# Patient Record
Sex: Male | Born: 1975 | Race: White | Hispanic: No | Marital: Married | Smoking: Former smoker
Health system: Southern US, Community
[De-identification: ages and names within clinical notes are randomized; demographics above are authoritative.]

## PROBLEM LIST (undated history)

## (undated) DIAGNOSIS — J069 Acute upper respiratory infection, unspecified: Secondary | ICD-10-CM

## (undated) DIAGNOSIS — R112 Nausea with vomiting, unspecified: Secondary | ICD-10-CM

## (undated) DIAGNOSIS — T4145XA Adverse effect of unspecified anesthetic, initial encounter: Secondary | ICD-10-CM

## (undated) DIAGNOSIS — T8859XA Other complications of anesthesia, initial encounter: Secondary | ICD-10-CM

## (undated) DIAGNOSIS — Z9889 Other specified postprocedural states: Secondary | ICD-10-CM

## (undated) DIAGNOSIS — I10 Essential (primary) hypertension: Secondary | ICD-10-CM

---

## 1993-10-05 DIAGNOSIS — R112 Nausea with vomiting, unspecified: Secondary | ICD-10-CM

## 1993-10-05 DIAGNOSIS — Z9889 Other specified postprocedural states: Secondary | ICD-10-CM

## 1993-10-05 HISTORY — PX: FRACTURE SURGERY: SHX138

## 1993-10-05 HISTORY — DX: Nausea with vomiting, unspecified: R11.2

## 1993-10-05 HISTORY — DX: Other specified postprocedural states: Z98.890

## 2005-04-22 ENCOUNTER — Emergency Department: Payer: Self-pay | Admitting: Emergency Medicine

## 2005-05-01 ENCOUNTER — Emergency Department: Payer: Self-pay | Admitting: Emergency Medicine

## 2008-03-15 ENCOUNTER — Emergency Department: Payer: Self-pay | Admitting: Emergency Medicine

## 2014-04-02 ENCOUNTER — Emergency Department: Payer: Self-pay | Admitting: Emergency Medicine

## 2014-08-06 ENCOUNTER — Emergency Department: Payer: Self-pay | Admitting: Emergency Medicine

## 2015-02-07 ENCOUNTER — Other Ambulatory Visit: Payer: Self-pay | Admitting: Internal Medicine

## 2015-02-07 DIAGNOSIS — N509 Disorder of male genital organs, unspecified: Secondary | ICD-10-CM

## 2015-02-08 ENCOUNTER — Inpatient Hospital Stay: Admission: RE | Admit: 2015-02-08 | Payer: Self-pay | Source: Ambulatory Visit

## 2015-02-08 ENCOUNTER — Other Ambulatory Visit: Payer: Self-pay

## 2015-02-08 ENCOUNTER — Ambulatory Visit: Payer: Self-pay

## 2015-02-12 ENCOUNTER — Ambulatory Visit
Admission: RE | Admit: 2015-02-12 | Discharge: 2015-02-12 | Disposition: A | Discharge status: Home / Self Care | Payer: BLUE CROSS/BLUE SHIELD | Source: Ambulatory Visit | Attending: Internal Medicine | Admitting: Internal Medicine

## 2015-02-12 ENCOUNTER — Ambulatory Visit: Admission: RE | Admit: 2015-02-12 | Payer: BLUE CROSS/BLUE SHIELD | Source: Ambulatory Visit

## 2015-02-12 DIAGNOSIS — N433 Hydrocele, unspecified: Secondary | ICD-10-CM | POA: Insufficient documentation

## 2015-02-12 DIAGNOSIS — N509 Disorder of male genital organs, unspecified: Secondary | ICD-10-CM

## 2015-02-12 DIAGNOSIS — N503 Cyst of epididymis: Secondary | ICD-10-CM | POA: Diagnosis not present

## 2015-09-05 DIAGNOSIS — J069 Acute upper respiratory infection, unspecified: Secondary | ICD-10-CM

## 2015-09-05 HISTORY — DX: Acute upper respiratory infection, unspecified: J06.9

## 2015-09-19 ENCOUNTER — Encounter
Admission: RE | Admit: 2015-09-19 | Discharge: 2015-09-19 | Disposition: A | Discharge status: Home / Self Care | Payer: Worker's Compensation | Source: Ambulatory Visit | Attending: Orthopedic Surgery | Admitting: Orthopedic Surgery

## 2015-09-19 DIAGNOSIS — Z01810 Encounter for preprocedural cardiovascular examination: Secondary | ICD-10-CM | POA: Insufficient documentation

## 2015-09-19 DIAGNOSIS — Z01812 Encounter for preprocedural laboratory examination: Secondary | ICD-10-CM | POA: Insufficient documentation

## 2015-09-19 HISTORY — DX: Other specified postprocedural states: Z98.890

## 2015-09-19 HISTORY — DX: Essential (primary) hypertension: I10

## 2015-09-19 HISTORY — DX: Adverse effect of unspecified anesthetic, initial encounter: T41.45XA

## 2015-09-19 HISTORY — DX: Other complications of anesthesia, initial encounter: T88.59XA

## 2015-09-19 HISTORY — DX: Nausea with vomiting, unspecified: R11.2

## 2015-09-19 HISTORY — DX: Acute upper respiratory infection, unspecified: J06.9

## 2015-09-19 LAB — URINALYSIS COMPLETE WITH MICROSCOPIC (ARMC ONLY)
BILIRUBIN URINE: NEGATIVE
Bacteria, UA: NONE SEEN
GLUCOSE, UA: NEGATIVE mg/dL
Hgb urine dipstick: NEGATIVE
KETONES UR: NEGATIVE mg/dL
Leukocytes, UA: NEGATIVE
Nitrite: NEGATIVE
PROTEIN: 100 mg/dL — AB
SQUAMOUS EPITHELIAL / LPF: NONE SEEN
Specific Gravity, Urine: 1.012 (ref 1.005–1.030)
pH: 5 (ref 5.0–8.0)

## 2015-09-19 LAB — PROTIME-INR
INR: 1.14
PROTHROMBIN TIME: 14.8 s (ref 11.4–15.0)

## 2015-09-19 LAB — CBC
HEMATOCRIT: 42.8 % (ref 40.0–52.0)
HEMOGLOBIN: 15.1 g/dL (ref 13.0–18.0)
MCH: 31.4 pg (ref 26.0–34.0)
MCHC: 35.4 g/dL (ref 32.0–36.0)
MCV: 88.7 fL (ref 80.0–100.0)
PLATELETS: 130 10*3/uL — AB (ref 150–440)
RBC: 4.83 MIL/uL (ref 4.40–5.90)
RDW: 13.2 % (ref 11.5–14.5)
WBC: 8.3 10*3/uL (ref 3.8–10.6)

## 2015-09-19 LAB — APTT: APTT: 33 s (ref 24–36)

## 2015-09-19 NOTE — Patient Instructions (Signed)
  Your procedure is scheduled on: Wednesday Dec. 22, 2016. Report to Same Day Surgery. To find out your arrival time please call [phone] between 1PM - 3PM on Tuesday Dec. 21,2106.  Remember: Instructions that are not followed completely may result in serious medical risk, up to and including death, or upon the discretion of your surgeon and anesthesiologist your surgery may need to be rescheduled.    _x___ 1. Do not eat food or drink liquids after midnight. No gum chewing or hard candies.     _x___ 2. No Alcohol for 24 hours before or after surgery.   ____ 3. Bring all medications with you on the day of surgery if instructed.    __x__ 4. Notify your doctor if there is any change in your medical condition     (cold, fever, infections).     Do not wear jewelry, make-up, hairpins, clips or nail polish.  Do not wear lotions, powders, or perfumes. You may wear deodorant.  Do not shave 48 hours prior to surgery. Men may shave face and neck.  Do not bring valuables to the hospital.    [facility] is not responsible for any belongings or valuables.               Contacts, dentures or bridgework may not be worn into surgery.  Leave your suitcase in the car. After surgery it may be brought to your room.  For patients admitted to the hospital, discharge time is determined by your treatment team.   Patients discharged the day of surgery will not be allowed to drive home.    Please read over the following fact sheets that you were given:   [facility] Preparing for Surgery  __x__ Take these medicines the morning of surgery with A SIP OF WATER:    1. amLODipine (NORVASC)    ____ Fleet Enema (as directed)   __x__ Use CHG Soap as directed  ____ Use inhalers on the day of surgery  ____ Stop metformin 2 days prior to surgery    ____ Take 1/2 of usual insulin dose the night before surgery and none on the morning of surgery.   ____ Stop Coumadin/Plavix/aspirin on does not  apply.  _x___ Stop Anti-inflammatories Ibuprofen, motrin, aleve, naproxen.  Ok to take tylenol for pain.   ____ Stop supplements until after surgery.    ____ Bring C-Pap to the hospital.

## 2015-09-26 ENCOUNTER — Ambulatory Visit
Admission: RE | Admit: 2015-09-26 | Discharge: 2015-09-26 | Disposition: A | Discharge status: Home / Self Care | Payer: Worker's Compensation | Source: Ambulatory Visit | Attending: Orthopedic Surgery | Admitting: Orthopedic Surgery

## 2015-09-26 ENCOUNTER — Ambulatory Visit: Payer: Worker's Compensation | Admitting: Anesthesiology

## 2015-09-26 ENCOUNTER — Encounter
Admission: RE | Disposition: A | Discharge status: Home / Self Care | Payer: Self-pay | Source: Ambulatory Visit | Attending: Orthopedic Surgery

## 2015-09-26 ENCOUNTER — Encounter: Payer: Self-pay | Admitting: *Deleted

## 2015-09-26 DIAGNOSIS — S83289A Other tear of lateral meniscus, current injury, unspecified knee, initial encounter: Secondary | ICD-10-CM | POA: Diagnosis present

## 2015-09-26 DIAGNOSIS — S83281A Other tear of lateral meniscus, current injury, right knee, initial encounter: Secondary | ICD-10-CM | POA: Diagnosis not present

## 2015-09-26 DIAGNOSIS — Z87891 Personal history of nicotine dependence: Secondary | ICD-10-CM | POA: Insufficient documentation

## 2015-09-26 DIAGNOSIS — Z79899 Other long term (current) drug therapy: Secondary | ICD-10-CM | POA: Diagnosis not present

## 2015-09-26 DIAGNOSIS — M94261 Chondromalacia, right knee: Secondary | ICD-10-CM | POA: Diagnosis not present

## 2015-09-26 DIAGNOSIS — Y939 Activity, unspecified: Secondary | ICD-10-CM | POA: Diagnosis not present

## 2015-09-26 DIAGNOSIS — X58XXXA Exposure to other specified factors, initial encounter: Secondary | ICD-10-CM | POA: Insufficient documentation

## 2015-09-26 DIAGNOSIS — I10 Essential (primary) hypertension: Secondary | ICD-10-CM | POA: Insufficient documentation

## 2015-09-26 DIAGNOSIS — Z6841 Body Mass Index (BMI) 40.0 and over, adult: Secondary | ICD-10-CM | POA: Diagnosis not present

## 2015-09-26 DIAGNOSIS — R945 Abnormal results of liver function studies: Secondary | ICD-10-CM | POA: Diagnosis not present

## 2015-09-26 HISTORY — PX: KNEE ARTHROSCOPY: SHX127

## 2015-09-26 SURGERY — ARTHROSCOPY, KNEE
Anesthesia: General | Site: Knee | Laterality: Right | Wound class: Clean

## 2015-09-26 MED ORDER — SUCCINYLCHOLINE CHLORIDE 20 MG/ML IJ SOLN
INTRAMUSCULAR | Status: DC | PRN
  Administered 2015-09-26: 120 mg via INTRAVENOUS

## 2015-09-26 MED ORDER — PROPOFOL 10 MG/ML IV BOLUS
INTRAVENOUS | Status: DC | PRN
  Administered 2015-09-26: 200 mg via INTRAVENOUS

## 2015-09-26 MED ORDER — ONDANSETRON HCL 4 MG/2ML IJ SOLN: 4.0000 mg | Freq: Once | INTRAMUSCULAR | Status: DC | PRN

## 2015-09-26 MED ORDER — ASPIRIN EC 325 MG PO TBEC: 325.0000 mg | DELAYED_RELEASE_TABLET | Freq: Every day | ORAL | Status: DC

## 2015-09-26 MED ORDER — CEFAZOLIN SODIUM-DEXTROSE 2-3 GM-% IV SOLR
2.0000 g | Freq: Once | INTRAVENOUS | Status: AC
  Administered 2015-09-26: 2 g via INTRAVENOUS

## 2015-09-26 MED ORDER — HYDROCODONE-ACETAMINOPHEN 5-325 MG PO TABS: 1.0000 | ORAL_TABLET | ORAL | Status: DC | PRN

## 2015-09-26 MED ORDER — CEFAZOLIN SODIUM-DEXTROSE 2-3 GM-% IV SOLR
INTRAVENOUS | Status: AC
  Filled 2015-09-26: qty 50

## 2015-09-26 MED ORDER — FAMOTIDINE 20 MG PO TABS
ORAL_TABLET | ORAL | Status: AC
Start: 2015-09-26 — End: 2015-09-26
  Administered 2015-09-26: 20 mg via ORAL
  Filled 2015-09-26: qty 1

## 2015-09-26 MED ORDER — LIDOCAINE HCL (PF) 4 % IJ SOLN
INTRAMUSCULAR | Status: DC | PRN
  Administered 2015-09-26: 5 mL via RESPIRATORY_TRACT

## 2015-09-26 MED ORDER — BUPIVACAINE-EPINEPHRINE 0.25% -1:200000 IJ SOLN
INTRAMUSCULAR | Status: DC | PRN
  Administered 2015-09-26: 30 mL

## 2015-09-26 MED ORDER — KETOROLAC TROMETHAMINE 30 MG/ML IJ SOLN
INTRAMUSCULAR | Status: DC | PRN
  Administered 2015-09-26: 30 mg via INTRAVENOUS

## 2015-09-26 MED ORDER — FENTANYL CITRATE (PF) 100 MCG/2ML IJ SOLN
INTRAMUSCULAR | Status: AC
  Filled 2015-09-26: qty 2

## 2015-09-26 MED ORDER — LIDOCAINE HCL 1 % IJ SOLN
INTRAMUSCULAR | Status: DC | PRN
  Administered 2015-09-26: 10 mL

## 2015-09-26 MED ORDER — ONDANSETRON HCL 4 MG PO TABS: 4.0000 mg | ORAL_TABLET | Freq: Three times a day (TID) | ORAL | Status: DC | PRN

## 2015-09-26 MED ORDER — ACETAMINOPHEN 10 MG/ML IV SOLN
INTRAVENOUS | Status: DC | PRN
  Administered 2015-09-26: 1000 mg via INTRAVENOUS

## 2015-09-26 MED ORDER — LACTATED RINGERS IV SOLN
INTRAVENOUS | Status: DC
  Administered 2015-09-26: 50 mL/h via INTRAVENOUS

## 2015-09-26 MED ORDER — FENTANYL CITRATE (PF) 100 MCG/2ML IJ SOLN
25.0000 ug | INTRAMUSCULAR | Status: DC | PRN
  Administered 2015-09-26 (×4): 25 ug via INTRAVENOUS

## 2015-09-26 MED ORDER — MIDAZOLAM HCL 2 MG/2ML IJ SOLN
INTRAMUSCULAR | Status: DC | PRN
  Administered 2015-09-26: 2 mg via INTRAVENOUS

## 2015-09-26 MED ORDER — FAMOTIDINE 20 MG PO TABS
20.0000 mg | ORAL_TABLET | Freq: Once | ORAL | Status: AC
  Administered 2015-09-26: 20 mg via ORAL

## 2015-09-26 MED ORDER — FENTANYL CITRATE (PF) 100 MCG/2ML IJ SOLN
INTRAMUSCULAR | Status: DC | PRN
  Administered 2015-09-26: 100 ug via INTRAVENOUS
  Administered 2015-09-26 (×3): 25 ug via INTRAVENOUS

## 2015-09-26 MED ORDER — LIDOCAINE HCL (CARDIAC) 20 MG/ML IV SOLN
INTRAVENOUS | Status: DC | PRN
  Administered 2015-09-26: 40 mg via INTRAVENOUS

## 2015-09-26 SURGICAL SUPPLY — 33 items
ATOMIZER TRACHEAL (MISCELLANEOUS) ×3 IMPLANT
BUR RADIUS 3.5 (BURR) IMPLANT
BUR RADIUS 4.0X18.5 (BURR) ×3 IMPLANT
CLOSURE WOUND 1/2 X4 (GAUZE/BANDAGES/DRESSINGS) ×1
COOLER POLAR GLACIER W/PUMP (MISCELLANEOUS) ×3 IMPLANT
DRAPE IMP U-DRAPE 54X76 (DRAPES) ×3 IMPLANT
DURAPREP 26ML APPLICATOR (WOUND CARE) ×9 IMPLANT
GAUZE PETRO XEROFOAM 1X8 (MISCELLANEOUS) ×3 IMPLANT
GAUZE SPONGE 4X4 12PLY STRL (GAUZE/BANDAGES/DRESSINGS) ×3 IMPLANT
GLOVE BIOGEL PI IND STRL 9 (GLOVE) ×1 IMPLANT
GLOVE BIOGEL PI INDICATOR 9 (GLOVE) ×2
GLOVE SURG 9.0 ORTHO LTXF (GLOVE) ×3 IMPLANT
GOWN STRL REUS W/ TWL LRG LVL3 (GOWN DISPOSABLE) ×1 IMPLANT
GOWN STRL REUS W/TWL 2XL LVL3 (GOWN DISPOSABLE) ×3 IMPLANT
GOWN STRL REUS W/TWL LRG LVL3 (GOWN DISPOSABLE) ×2
IV LACTATED RINGER IRRG 3000ML (IV SOLUTION) ×12
IV LR IRRIG 3000ML ARTHROMATIC (IV SOLUTION) ×6 IMPLANT
KIT RM TURNOVER STRD PROC AR (KITS) ×3 IMPLANT
MANIFOLD NEPTUNE II (INSTRUMENTS) ×3 IMPLANT
PACK ARTHROSCOPY KNEE (MISCELLANEOUS) ×3 IMPLANT
PAD ABD DERMACEA PRESS 5X9 (GAUZE/BANDAGES/DRESSINGS) ×6 IMPLANT
PAD WRAPON POLAR KNEE (MISCELLANEOUS) ×1 IMPLANT
SET TUBE SUCT SHAVER OUTFL 24K (TUBING) ×3 IMPLANT
SOL PREP PVP 2OZ (MISCELLANEOUS) ×3
SOLUTION PREP PVP 2OZ (MISCELLANEOUS) ×1 IMPLANT
STRIP CLOSURE SKIN 1/2X4 (GAUZE/BANDAGES/DRESSINGS) ×2 IMPLANT
SUT ETHILON 4-0 (SUTURE) ×2
SUT ETHILON 4-0 FS2 18XMFL BLK (SUTURE) ×1
SUTURE ETHLN 4-0 FS2 18XMF BLK (SUTURE) ×1 IMPLANT
TUBING ARTHRO INFLOW-ONLY STRL (TUBING) ×3 IMPLANT
WAND HAND CNTRL MULTIVAC 50 (MISCELLANEOUS) ×3 IMPLANT
WAND HAND CNTRL MULTIVAC 90 (MISCELLANEOUS) ×3 IMPLANT
WRAPON POLAR PAD KNEE (MISCELLANEOUS) ×3

## 2015-09-26 NOTE — Anesthesia Procedure Notes (Signed)
Procedure Name: Intubation Date/Time: 09/26/2015 12:23 PM Performed by: Rosaria Ferries, Kelce Bouton Pre-anesthesia Checklist: Patient identified, Emergency Drugs available, Suction available and Patient being monitored Patient Re-evaluated:Patient Re-evaluated prior to inductionOxygen Delivery Method: Circle system utilized Preoxygenation: Pre-oxygenation with 100% oxygen Intubation Type: IV induction Laryngoscope Size: Mac and 3 Grade View: Grade I Tube type: Oral Tube size: 7.0 mm Number of attempts: 1 Secured at: 23 cm Tube secured with: Tape Dental Injury: Teeth and Oropharynx as per pre-operative assessment

## 2015-09-26 NOTE — Anesthesia Preprocedure Evaluation (Signed)
Anesthesia Evaluation  Patient identified by MRN, date of birth, ID band Patient awake    History of Anesthesia Complications (+) PONV  Airway Mallampati: II       Dental  (+) Teeth Intact   Pulmonary former smoker,     + decreased breath sounds      Cardiovascular hypertension, Pt. on home beta blockers  Rhythm:Regular     Neuro/Psych    GI/Hepatic negative GI ROS, Neg liver ROS,   Endo/Other  negative endocrine ROS  Renal/GU negative Renal ROS     Musculoskeletal   Abdominal (+) + obese,   Peds negative pediatric ROS (+)  Hematology negative hematology ROS (+)   Anesthesia Other Findings   Reproductive/Obstetrics                             Anesthesia Physical Anesthesia Plan  ASA: III  Anesthesia Plan: General   Post-op Pain Management:    Induction: Intravenous  Airway Management Planned: LMA  Additional Equipment:   Intra-op Plan:   Post-operative Plan: Extubation in OR  Informed Consent: I have reviewed the patients History and Physical, chart, labs and discussed the procedure including the risks, benefits and alternatives for the proposed anesthesia with the patient or authorized representative who has indicated his/her understanding and acceptance.     Plan Discussed with: CRNA  Anesthesia Plan Comments:         Anesthesia Quick Evaluation

## 2015-09-26 NOTE — Discharge Instructions (Signed)

## 2015-09-26 NOTE — Op Note (Signed)
  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 male with a diagnosis of TEAR OF LATERAL MENISCUS of the RIGHT KNEE who failed conservative management and elected for surgical management.  He is a worker's compensation case.  The risks benefits and alternatives were discussed with the patient preoperatively including the risks of infection, bleeding, nerve injury, knee stiffness, persistent pain, osteoarthritis and the need for further surgery. Medical  risks include DVT and pulmonary embolism, myocardial infarction, stroke, pneumonia, respiratory failure and death. The patient understood these risks and wished to proceed.  OPERATIVE FINDINGS: Tear of undersurface of the body of the lateral meniscus, diffuse tricompartmental chondral softening most evident over the lateral tibial plateau with a grade III chondral lesion of the femoral trochlea.  OPERATIVE PROCEDURE: Patient was met in the preoperative area. The operative extremity was signed with the word yes and my initials according the hospital's correct site of surgery protocol.  The patient was brought to the operating room where they was placed supine on the operative table. General anesthesia was administered. The patient was prepped and draped in a sterile fashion.  A timeout was performed to verify the patient's name, date of birth, medical record number, correct site of surgery correct procedure to be performed. It was also used to verify the patient had  had received antibiotics that all appropriate instruments, and radiographic studies were available in the room. Once all in attendance were in agreement, the case began.  Proposed arthroscopy incisions were drawn out with a surgical marker. These were pre-injected with 1% lidocaine plain. An 11 blade was used to establish an inferior lateral and inferomedial portals. The inferomedial portal was created using a 18-gauge spinal needle under direct visualization.  A full diagnostic examination of the knee was performed including the suprapatellar pouch, patellofemoral joint, medial lateral compartments as well as the medial lateral gutters, the intercondylar notch in the posterior knee.  Patient had the lateral meniscus tear treated with debridement with a 4.0 resector shaver blade and straight and curved duckbill baskets. The meniscus was debrided until a stable rim was achieved. A chondroplasty of the lateral tibial plateau and trochlea using a 4-0 resector shaver blade. A partial synovectomy was also performed using a 4-0 resector shaver blade and 90 ArthroCare wand.  All bleeding vessels were coagulated with the ArthroCare wand.  The knee was then copiously lavaged. All arthroscopic incisions removed. The 2 arthroscopy portals were closed with 4-0 nylon. Patient then had an intra-articular injection of 30 cc of 0.25% Marcaine with epinephrine.  Steri-Strips were applied along with a dry sterile and compressive dressing.  Patient was then brought to the PACU in stable condition. I scrubbed and present the entire case and all sharp and instrument counts were correct at the conclusion the case. I spoke to the patient's family postoperatively to let them know the case it done without complication and the patient was stable in the recovery room.    KTimoteo Gaul MD
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
## 2015-09-26 NOTE — Transfer of Care (Signed)
Immediate Anesthesia Transfer of Care Note  Patient: Brandon Ford  Procedure(s) Performed: Procedure(s): ARTHROSCOPY KNEE, RIGHT KNEE ARTHROSCOPIC LATERAL MENISECTOMY, CHONDROPLASTY (Right)  Patient Location: PACU  Anesthesia Type:General  Level of Consciousness: awake, alert , oriented and patient cooperative  Airway & Oxygen Therapy: Patient Spontanous Breathing and Patient connected to nasal cannula oxygen  Post-op Assessment: Report given to RN and Post -op Vital signs reviewed and stable  Post vital signs: Reviewed and stable  Last Vitals:  Filed Vitals:   09/26/15 0854 09/26/15 1355  BP: 154/99 144/92  Pulse: 81 79  Temp: 36.8 C 36.6 C  Resp: 18     Complications: No apparent anesthesia complications

## 2015-09-26 NOTE — Anesthesia Postprocedure Evaluation (Signed)
Anesthesia Post Note  Patient: Brandon Ford  Procedure(s) Performed: Procedure(s) (LRB): ARTHROSCOPY KNEE, RIGHT KNEE ARTHROSCOPIC LATERAL MENISECTOMY, CHONDROPLASTY (Right)  Patient location during evaluation: PACU Anesthesia Type: General Level of consciousness: awake and alert Pain management: satisfactory to patient Vital Signs Assessment: post-procedure vital signs reviewed and stable Cardiovascular status: stable Anesthetic complications: no    Last Vitals:  Filed Vitals:   09/26/15 1440 09/26/15 1445  BP:  139/90  Pulse: 80 71  Temp:    Resp: 14 13    Last Pain:  Filed Vitals:   09/26/15 1445  PainSc: 0-No pain                 VAN STAVEREN,Marijayne Rauth

## 2015-09-27 ENCOUNTER — Encounter: Payer: Self-pay | Admitting: Orthopedic Surgery

## 2015-10-02 NOTE — Addendum Note (Signed)
Addendum  created 10/02/15 0900 by Johnna Acosta, CRNA   Modules edited: Anesthesia Responsible Staff

## 2016-04-04 ENCOUNTER — Emergency Department
Admission: EM | Admit: 2016-04-04 | Discharge: 2016-04-04 | Disposition: A | Discharge status: Home / Self Care | Payer: BLUE CROSS/BLUE SHIELD | Attending: Emergency Medicine | Admitting: Emergency Medicine

## 2016-04-04 DIAGNOSIS — R5381 Other malaise: Secondary | ICD-10-CM | POA: Insufficient documentation

## 2016-04-04 DIAGNOSIS — T50905A Adverse effect of unspecified drugs, medicaments and biological substances, initial encounter: Secondary | ICD-10-CM

## 2016-04-04 DIAGNOSIS — Z87891 Personal history of nicotine dependence: Secondary | ICD-10-CM | POA: Diagnosis not present

## 2016-04-04 DIAGNOSIS — I10 Essential (primary) hypertension: Secondary | ICD-10-CM | POA: Diagnosis not present

## 2016-04-04 DIAGNOSIS — Z792 Long term (current) use of antibiotics: Secondary | ICD-10-CM | POA: Insufficient documentation

## 2016-04-04 DIAGNOSIS — L03116 Cellulitis of left lower limb: Secondary | ICD-10-CM | POA: Diagnosis present

## 2016-04-04 DIAGNOSIS — T364X5A Adverse effect of tetracyclines, initial encounter: Secondary | ICD-10-CM | POA: Insufficient documentation

## 2016-04-04 DIAGNOSIS — Z7982 Long term (current) use of aspirin: Secondary | ICD-10-CM | POA: Insufficient documentation

## 2016-04-04 DIAGNOSIS — Z79899 Other long term (current) drug therapy: Secondary | ICD-10-CM | POA: Diagnosis not present

## 2016-04-04 MED ORDER — CLINDAMYCIN HCL 150 MG PO CAPS
300.0000 mg | ORAL_CAPSULE | Freq: Once | ORAL | Status: AC
  Administered 2016-04-04: 300 mg via ORAL
  Filled 2016-04-04: qty 2

## 2016-04-04 MED ORDER — CLINDAMYCIN HCL 300 MG PO CAPS: 300.0000 mg | ORAL_CAPSULE | Freq: Four times a day (QID) | ORAL | Status: AC

## 2016-04-04 NOTE — ED Notes (Signed)
Patient reports noted area behind left knee that is ? Bug bite.  States went to walk-in clinic and started antibiotics.  Reports area appears worse and he starting to have flu symptoms.

## 2016-04-04 NOTE — Discharge Instructions (Signed)
You appear to have a side effect reaction to your antibiotic. You should stop the doxycycline. Start the Clindamycin tomorrow as directed. Continue to monitor for changes to the area of cellulitis. Follow-up with Dr. Ouida Sills or return to the ED as needed.   Cellulitis Cellulitis is an infection of the skin and the tissue beneath it. The infected area is usually red and tender. Cellulitis occurs most often in the arms and lower legs.  CAUSES  Cellulitis is caused by bacteria that enter the skin through cracks or cuts in the skin. The most common types of bacteria that cause cellulitis are staphylococci and streptococci. SIGNS AND SYMPTOMS   Redness and warmth.  Swelling.  Tenderness or pain.  Fever. DIAGNOSIS  Your health care provider can usually determine what is wrong based on a physical exam. Blood tests may also be done. TREATMENT  Treatment usually involves taking an antibiotic medicine. HOME CARE INSTRUCTIONS   Take your antibiotic medicine as directed by your health care provider. Finish the antibiotic even if you start to feel better.  Keep the infected arm or leg elevated to reduce swelling.  Apply a warm cloth to the affected area up to 4 times per day to relieve pain.  Take medicines only as directed by your health care provider.  Keep all follow-up visits as directed by your health care provider. SEEK MEDICAL CARE IF:   You notice red streaks coming from the infected area.  Your red area gets larger or turns dark in color.  Your bone or joint underneath the infected area becomes painful after the skin has healed.  Your infection returns in the same area or another area.  You notice a swollen bump in the infected area.  You develop new symptoms.  You have a fever. SEEK IMMEDIATE MEDICAL CARE IF:   You feel very sleepy.  You develop vomiting or diarrhea.  You have a general ill feeling (malaise) with muscle aches and pains.   This information is not  intended to replace advice given to you by your health care provider. Make sure you discuss any questions you have with your health care provider.   Document Released: 07/01/2005 Document Revised: 06/12/2015 Document Reviewed: 12/07/2011 Elsevier Interactive Patient Education 2016 [place] Allergy A drug allergy means you have a strange reaction to a medicine. You may have puffiness (swelling), itching, red rashes, and hives. Some allergic reactions can be life-threatening. HOME CARE  If you do not know what caused your reaction:  Write down medicines you use.  Write down any problems you have after using medicine.  Avoid things that cause a reaction.  You can see an allergy doctor to be tested for allergies. If you have hives or a rash:  Take medicine as told by your doctor.  Place cold cloths on your skin.  Do not take hot baths or hot showers. Take baths in cool water. If you are severely allergic:  Wear a medical bracelet or necklace that lists your allergy.  Carry your allergy kit or medicine shot to treat severe allergic reactions with you. These can save your life.  Do not drive until medicine from your shot has worn off, unless your doctor says it is okay. GET HELP RIGHT AWAY IF:   Your mouth is puffy, or you have trouble breathing.  You have a tight feeling in your chest or throat.  You have hives, puffiness, or itching all over your body.  You throw up (vomit) or  have watery poop (diarrhea).  You feel dizzy or pass out (faint).  You think you are having a reaction. Problems often start within 30 minutes after taking a medicine.  You are getting worse, not better.  You have new problems.  Your problems go away and then come back. This is an emergency. Use your medicine shot or allergy kit as told. Call yourlocal emergency services (911 in [place] after the shot. Even if you feel better after the shot, you need to go to the hospital. You may need  more medicine to control a severe reaction. MAKE SURE YOU:  Understand these instructions.  Will watch your condition.  Will get help right away if you are not doing well or get worse.   This information is not intended to replace advice given to you by your health care provider. Make sure you discuss any questions you have with your health care provider.   Document Released: 10/29/2004 Document Revised: 12/14/2011 Document Reviewed: 04/23/2015 Elsevier Interactive Patient Education [employer].
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
## 2016-04-04 NOTE — ED Notes (Signed)
Pt seen at urgent care yesterday, rx'd doxycycline 100 mg. Pt comes today because he has experienced flu-like sxs and increased area of erythema surrounding darkened center.

## 2019-10-27 ENCOUNTER — Inpatient Hospital Stay
Admission: EM | Admit: 2019-10-27 | Discharge: 2019-10-29 | DRG: 177 | Disposition: A | Discharge status: Home / Self Care | Payer: BC Managed Care – PPO | Attending: Internal Medicine | Admitting: Internal Medicine

## 2019-10-27 ENCOUNTER — Encounter: Payer: Self-pay | Admitting: Emergency Medicine

## 2019-10-27 ENCOUNTER — Emergency Department: Payer: BC Managed Care – PPO

## 2019-10-27 ENCOUNTER — Other Ambulatory Visit: Payer: Self-pay

## 2019-10-27 DIAGNOSIS — J1282 Pneumonia due to coronavirus disease 2019: Secondary | ICD-10-CM | POA: Diagnosis present

## 2019-10-27 DIAGNOSIS — J069 Acute upper respiratory infection, unspecified: Secondary | ICD-10-CM | POA: Diagnosis present

## 2019-10-27 DIAGNOSIS — R05 Cough: Secondary | ICD-10-CM | POA: Diagnosis present

## 2019-10-27 DIAGNOSIS — E876 Hypokalemia: Secondary | ICD-10-CM | POA: Diagnosis present

## 2019-10-27 DIAGNOSIS — Z7982 Long term (current) use of aspirin: Secondary | ICD-10-CM | POA: Diagnosis not present

## 2019-10-27 DIAGNOSIS — Z87891 Personal history of nicotine dependence: Secondary | ICD-10-CM

## 2019-10-27 DIAGNOSIS — Z23 Encounter for immunization: Secondary | ICD-10-CM | POA: Diagnosis not present

## 2019-10-27 DIAGNOSIS — Z79899 Other long term (current) drug therapy: Secondary | ICD-10-CM | POA: Diagnosis not present

## 2019-10-27 DIAGNOSIS — R059 Cough, unspecified: Secondary | ICD-10-CM

## 2019-10-27 DIAGNOSIS — Z6841 Body Mass Index (BMI) 40.0 and over, adult: Secondary | ICD-10-CM

## 2019-10-27 DIAGNOSIS — R739 Hyperglycemia, unspecified: Secondary | ICD-10-CM | POA: Diagnosis present

## 2019-10-27 DIAGNOSIS — U071 COVID-19: Secondary | ICD-10-CM | POA: Diagnosis present

## 2019-10-27 DIAGNOSIS — N179 Acute kidney failure, unspecified: Secondary | ICD-10-CM | POA: Diagnosis present

## 2019-10-27 DIAGNOSIS — R197 Diarrhea, unspecified: Secondary | ICD-10-CM | POA: Diagnosis present

## 2019-10-27 DIAGNOSIS — T380X5A Adverse effect of glucocorticoids and synthetic analogues, initial encounter: Secondary | ICD-10-CM | POA: Diagnosis present

## 2019-10-27 DIAGNOSIS — I10 Essential (primary) hypertension: Secondary | ICD-10-CM | POA: Diagnosis present

## 2019-10-27 LAB — BASIC METABOLIC PANEL
Anion gap: 12 (ref 5–15)
BUN: 17 mg/dL (ref 6–20)
CO2: 20 mmol/L — ABNORMAL LOW (ref 22–32)
Calcium: 8.6 mg/dL — ABNORMAL LOW (ref 8.9–10.3)
Chloride: 102 mmol/L (ref 98–111)
Creatinine, Ser: 1.28 mg/dL — ABNORMAL HIGH (ref 0.61–1.24)
GFR calc Af Amer: >60 mL/min (ref >60)
GFR calc non Af Amer: >60 mL/min (ref >60)
Glucose, Bld: 135 mg/dL — ABNORMAL HIGH (ref 70–99)
Potassium: 3.2 mmol/L — ABNORMAL LOW (ref 3.5–5.1)
Sodium: 134 mmol/L — ABNORMAL LOW (ref 135–145)

## 2019-10-27 LAB — BRAIN NATRIURETIC PEPTIDE: B Natriuretic Peptide: 26 pg/mL (ref 0.0–100.0)

## 2019-10-27 LAB — LACTIC ACID, PLASMA
Lactic Acid, Venous: 1.1 mmol/L (ref 0.5–1.9)
Lactic Acid, Venous: 1.3 mmol/L (ref 0.5–1.9)

## 2019-10-27 LAB — CBC
HCT: 43.6 % (ref 39.0–52.0)
Hemoglobin: 15.8 g/dL (ref 13.0–17.0)
MCH: 30.6 pg (ref 26.0–34.0)
MCHC: 36.2 g/dL — ABNORMAL HIGH (ref 30.0–36.0)
MCV: 84.5 fL (ref 80.0–100.0)
Platelets: 141 10*3/uL — ABNORMAL LOW (ref 150–400)
RBC: 5.16 MIL/uL (ref 4.22–5.81)
RDW: 12.7 % (ref 11.5–15.5)
WBC: 4.6 10*3/uL (ref 4.0–10.5)
nRBC: 0 % (ref 0.0–0.2)

## 2019-10-27 LAB — PROCALCITONIN: Procalcitonin: <0.1 ng/mL

## 2019-10-27 LAB — ABO/RH: ABO/RH(D): O POS

## 2019-10-27 LAB — TROPONIN I (HIGH SENSITIVITY): Troponin I (High Sensitivity): 13 ng/L (ref <18)

## 2019-10-27 LAB — HEPATIC FUNCTION PANEL
ALT: 31 U/L (ref 0–44)
AST: 38 U/L (ref 15–41)
Albumin: 3.6 g/dL (ref 3.5–5.0)
Alkaline Phosphatase: 30 U/L — ABNORMAL LOW (ref 38–126)
Bilirubin, Direct: 0.2 mg/dL (ref 0.0–0.2)
Indirect Bilirubin: 0.8 mg/dL (ref 0.3–0.9)
Total Bilirubin: 1 mg/dL (ref 0.3–1.2)
Total Protein: 7.3 g/dL (ref 6.5–8.1)

## 2019-10-27 LAB — GLUCOSE, CAPILLARY: Glucose-Capillary: 128 mg/dL — ABNORMAL HIGH (ref 70–99)

## 2019-10-27 LAB — FIBRIN DERIVATIVES D-DIMER (ARMC ONLY): Fibrin derivatives D-dimer (ARMC): 711.4 ng/mL (FEU) — ABNORMAL HIGH (ref 0.00–499.00)

## 2019-10-27 LAB — FERRITIN: Ferritin: 805 ng/mL — ABNORMAL HIGH (ref 24–336)

## 2019-10-27 LAB — TRIGLYCERIDES: Triglycerides: 88 mg/dL (ref <150)

## 2019-10-27 LAB — C-REACTIVE PROTEIN: CRP: 7.4 mg/dL — ABNORMAL HIGH (ref <1.0)

## 2019-10-27 LAB — FIBRINOGEN: Fibrinogen: 578 mg/dL — ABNORMAL HIGH (ref 210–475)

## 2019-10-27 LAB — LACTATE DEHYDROGENASE: LDH: 338 U/L — ABNORMAL HIGH (ref 98–192)

## 2019-10-27 LAB — HEPATITIS B SURFACE ANTIGEN: Hepatitis B Surface Ag: NONREACTIVE

## 2019-10-27 MED ORDER — ASCORBIC ACID 500 MG PO TABS
500.0000 mg | ORAL_TABLET | Freq: Every day | ORAL | Status: DC
  Administered 2019-10-27 – 2019-10-29 (×3): 500 mg via ORAL
  Filled 2019-10-27 (×3): qty 1

## 2019-10-27 MED ORDER — DEXAMETHASONE SODIUM PHOSPHATE 10 MG/ML IJ SOLN
6.0000 mg | Freq: Once | INTRAMUSCULAR | Status: AC
  Administered 2019-10-27: 14:00:00 6 mg via INTRAVENOUS
  Filled 2019-10-27: qty 1

## 2019-10-27 MED ORDER — AMLODIPINE BESYLATE 5 MG PO TABS
5.0000 mg | ORAL_TABLET | Freq: Every day | ORAL | Status: DC
  Administered 2019-10-27: 21:00:00 5 mg via ORAL
  Filled 2019-10-27: qty 1

## 2019-10-27 MED ORDER — SODIUM CHLORIDE 0.9 % IV SOLN
200.0000 mg | Freq: Once | INTRAVENOUS | Status: AC
  Administered 2019-10-27: 15:00:00 200 mg via INTRAVENOUS
  Filled 2019-10-27: qty 200

## 2019-10-27 MED ORDER — ALBUTEROL SULFATE HFA 108 (90 BASE) MCG/ACT IN AERS
2.0000 | INHALATION_SPRAY | RESPIRATORY_TRACT | Status: DC | PRN
  Filled 2019-10-27: qty 6.7

## 2019-10-27 MED ORDER — ZINC SULFATE 220 (50 ZN) MG PO CAPS
220.0000 mg | ORAL_CAPSULE | Freq: Every day | ORAL | Status: DC
  Administered 2019-10-27 – 2019-10-29 (×3): 220 mg via ORAL
  Filled 2019-10-27 (×3): qty 1

## 2019-10-27 MED ORDER — HYDRALAZINE HCL 50 MG PO TABS
25.0000 mg | ORAL_TABLET | Freq: Three times a day (TID) | ORAL | Status: DC | PRN
  Administered 2019-10-27 – 2019-10-28 (×2): 25 mg via ORAL
  Filled 2019-10-27 (×2): qty 1

## 2019-10-27 MED ORDER — ACETAMINOPHEN 325 MG PO TABS: 650.0000 mg | ORAL_TABLET | Freq: Four times a day (QID) | ORAL | Status: DC | PRN

## 2019-10-27 MED ORDER — DM-GUAIFENESIN ER 30-600 MG PO TB12
1.0000 | ORAL_TABLET | Freq: Two times a day (BID) | ORAL | Status: DC
  Administered 2019-10-27 – 2019-10-29 (×4): 1 via ORAL
  Filled 2019-10-27 (×4): qty 1

## 2019-10-27 MED ORDER — SODIUM CHLORIDE 0.9% FLUSH
3.0000 mL | Freq: Once | INTRAVENOUS | Status: AC
  Administered 2019-10-28: 12:00:00 3 mL via INTRAVENOUS

## 2019-10-27 MED ORDER — POTASSIUM CHLORIDE CRYS ER 20 MEQ PO TBCR
40.0000 meq | EXTENDED_RELEASE_TABLET | Freq: Once | ORAL | Status: AC
  Administered 2019-10-27: 40 meq via ORAL
  Filled 2019-10-27: qty 2

## 2019-10-27 MED ORDER — ONDANSETRON HCL 4 MG/2ML IJ SOLN: 4.0000 mg | Freq: Three times a day (TID) | INTRAMUSCULAR | Status: DC | PRN

## 2019-10-27 MED ORDER — METHYLPREDNISOLONE SODIUM SUCC 125 MG IJ SOLR
80.0000 mg | Freq: Two times a day (BID) | INTRAMUSCULAR | Status: DC
  Administered 2019-10-27: 80 mg via INTRAVENOUS
  Filled 2019-10-27: qty 2

## 2019-10-27 MED ORDER — ENOXAPARIN SODIUM 80 MG/0.8ML ~~LOC~~ SOLN
80.0000 mg | SUBCUTANEOUS | Status: DC
  Administered 2019-10-27 – 2019-10-28 (×2): 80 mg via SUBCUTANEOUS
  Filled 2019-10-27 (×3): qty 0.8

## 2019-10-27 MED ORDER — IPRATROPIUM BROMIDE HFA 17 MCG/ACT IN AERS
2.0000 | INHALATION_SPRAY | RESPIRATORY_TRACT | Status: DC
  Administered 2019-10-27 – 2019-10-29 (×9): 2 via RESPIRATORY_TRACT
  Filled 2019-10-27 (×3): qty 12.9

## 2019-10-27 MED ORDER — SODIUM CHLORIDE 0.9 % IV SOLN
100.0000 mg | Freq: Every day | INTRAVENOUS | Status: DC
  Administered 2019-10-28 – 2019-10-29 (×2): 100 mg via INTRAVENOUS
  Filled 2019-10-27 (×3): qty 20

## 2019-10-27 MED ORDER — INFLUENZA VAC SPLIT QUAD 0.5 ML IM SUSY: 0.5000 mL | PREFILLED_SYRINGE | INTRAMUSCULAR | Status: DC

## 2019-10-27 NOTE — ED Notes (Signed)
Pt oxygen saturation 92% on room air, This RN placed pt on 2L via nasal cannula. Oxygen saturation now 98% on 2L. Pt also assisted to recliner chair per request.
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
## 2019-10-27 NOTE — Consult Note (Signed)
Remdesivir - Pharmacy Brief Note   O:  ALT: N/A CXR: Diffuse severe bilateral interstitial prominence. Interstitial edema and/or pneumonitis could present in this fashion in this known COVID-19 positive patient. SpO2: 91% on RA   A/P:  Unable to obtain positive COVID result, although patient states that she had positive result this past Sunday. Shortness of breath started yesterday. Will initiate therapy and follow up with repeat result.   Remdesivir 200 mg IVPB once followed by 100 mg IVPB daily x 4 days.   Pearla Dubonnet, PharmD Clinical Pharmacist 10/27/2019 1:39 PM

## 2019-10-27 NOTE — Progress Notes (Signed)
Anticoagulation monitoring(Lovenox):  44 yo male ordered Lovenox 40 mg Q24h  Filed Weights   10/27/19 1210 10/27/19 1217  Weight: (!) 360 lb (163.3 kg) (!) 360 lb (163.3 kg)   BMI 51.65   Lab Results  Component Value Date   CREATININE 1.28 (H) 10/27/2019   Estimated Creatinine Clearance: 112.5 mL/min (A) (by C-G formula based on SCr of 1.28 mg/dL (H)). Hemoglobin & Hematocrit     Component Value Date/Time   HGB 15.8 10/27/2019 1222   HCT 43.6 10/27/2019 1222     Per Protocol for Patient with estCrcl < 30 ml/min and BMI > 40, Covid positive, D-Dimer < 5000,  will transition to Lovenox 80 mg Q24h (0.5 mg/kg SQ Q24H).

## 2019-10-27 NOTE — ED Triage Notes (Signed)
FIRST NURSE NOTE- here for [facility]. covid +. sats 91 % RA at check in, HR 130. Pulled next for triage.
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
## 2019-10-27 NOTE — ED Triage Notes (Signed)
Pt ED via POV c/o weakness and shortness of breath. Pt states that he tested positive for COVID on Sunday. Pt states that he has had diarrhea x 6 days. Shortness of breath started yesterday. Pt tachycardic in triage.

## 2019-10-28 LAB — COMPREHENSIVE METABOLIC PANEL
ALT: 30 U/L (ref 0–44)
AST: 33 U/L (ref 15–41)
Albumin: 3.4 g/dL — ABNORMAL LOW (ref 3.5–5.0)
Alkaline Phosphatase: 28 U/L — ABNORMAL LOW (ref 38–126)
Anion gap: 11 (ref 5–15)
BUN: 23 mg/dL — ABNORMAL HIGH (ref 6–20)
CO2: 21 mmol/L — ABNORMAL LOW (ref 22–32)
Calcium: 8.7 mg/dL — ABNORMAL LOW (ref 8.9–10.3)
Chloride: 106 mmol/L (ref 98–111)
Creatinine, Ser: 1.24 mg/dL (ref 0.61–1.24)
GFR calc Af Amer: >60 mL/min (ref >60)
GFR calc non Af Amer: >60 mL/min (ref >60)
Glucose, Bld: 166 mg/dL — ABNORMAL HIGH (ref 70–99)
Potassium: 3.8 mmol/L (ref 3.5–5.1)
Sodium: 138 mmol/L (ref 135–145)
Total Bilirubin: 1 mg/dL (ref 0.3–1.2)
Total Protein: 7.4 g/dL (ref 6.5–8.1)

## 2019-10-28 LAB — CBC WITH DIFFERENTIAL/PLATELET
Abs Immature Granulocytes: 0.02 10*3/uL (ref 0.00–0.07)
Basophils Absolute: 0 10*3/uL (ref 0.0–0.1)
Basophils Relative: 0 %
Eosinophils Absolute: 0 10*3/uL (ref 0.0–0.5)
Eosinophils Relative: 0 %
HCT: 43.8 % (ref 39.0–52.0)
Hemoglobin: 15.7 g/dL (ref 13.0–17.0)
Immature Granulocytes: 1 %
Lymphocytes Relative: 14 %
Lymphs Abs: 0.4 10*3/uL — ABNORMAL LOW (ref 0.7–4.0)
MCH: 30.7 pg (ref 26.0–34.0)
MCHC: 35.8 g/dL (ref 30.0–36.0)
MCV: 85.5 fL (ref 80.0–100.0)
Monocytes Absolute: 0.2 10*3/uL (ref 0.1–1.0)
Monocytes Relative: 7 %
Neutro Abs: 2.2 10*3/uL (ref 1.7–7.7)
Neutrophils Relative %: 78 %
Platelets: 142 10*3/uL — ABNORMAL LOW (ref 150–400)
RBC: 5.12 MIL/uL (ref 4.22–5.81)
RDW: 12.8 % (ref 11.5–15.5)
WBC: 2.8 10*3/uL — ABNORMAL LOW (ref 4.0–10.5)
nRBC: 0 % (ref 0.0–0.2)

## 2019-10-28 LAB — HEMOGLOBIN A1C
Hgb A1c MFr Bld: 4.7 % — ABNORMAL LOW (ref 4.8–5.6)
Mean Plasma Glucose: 88.19 mg/dL

## 2019-10-28 LAB — C-REACTIVE PROTEIN: CRP: 7.4 mg/dL — ABNORMAL HIGH (ref <1.0)

## 2019-10-28 LAB — HIV ANTIBODY (ROUTINE TESTING W REFLEX): HIV Screen 4th Generation wRfx: NONREACTIVE

## 2019-10-28 LAB — FIBRIN DERIVATIVES D-DIMER (ARMC ONLY): Fibrin derivatives D-dimer (ARMC): 600.35 ng/mL (FEU) — ABNORMAL HIGH (ref 0.00–499.00)

## 2019-10-28 LAB — GLUCOSE, CAPILLARY
Glucose-Capillary: 124 mg/dL — ABNORMAL HIGH (ref 70–99)
Glucose-Capillary: 185 mg/dL — ABNORMAL HIGH (ref 70–99)

## 2019-10-28 LAB — MAGNESIUM: Magnesium: 2.2 mg/dL (ref 1.7–2.4)

## 2019-10-28 LAB — FERRITIN: Ferritin: 711 ng/mL — ABNORMAL HIGH (ref 24–336)

## 2019-10-28 MED ORDER — INSULIN ASPART 100 UNIT/ML ~~LOC~~ SOLN
0.0000 [IU] | Freq: Three times a day (TID) | SUBCUTANEOUS | Status: DC
  Administered 2019-10-28 – 2019-10-29 (×2): 1 [IU] via SUBCUTANEOUS
  Administered 2019-10-29: 2 [IU] via SUBCUTANEOUS
  Filled 2019-10-28 (×3): qty 1

## 2019-10-28 MED ORDER — HYDRALAZINE HCL 50 MG PO TABS
25.0000 mg | ORAL_TABLET | Freq: Three times a day (TID) | ORAL | Status: DC
  Administered 2019-10-28 – 2019-10-29 (×2): 25 mg via ORAL
  Filled 2019-10-28 (×2): qty 1

## 2019-10-28 MED ORDER — ENSURE MAX PROTEIN PO LIQD
11.0000 [oz_av] | Freq: Two times a day (BID) | ORAL | Status: DC
  Administered 2019-10-28 – 2019-10-29 (×3): 11 [oz_av] via ORAL
  Filled 2019-10-28: qty 330

## 2019-10-28 MED ORDER — INSULIN ASPART 100 UNIT/ML ~~LOC~~ SOLN: 0.0000 [IU] | Freq: Every day | SUBCUTANEOUS | Status: DC

## 2019-10-28 MED ORDER — HYDROCOD POLST-CPM POLST ER 10-8 MG/5ML PO SUER
5.0000 mL | Freq: Two times a day (BID) | ORAL | Status: DC
  Administered 2019-10-28 – 2019-10-29 (×3): 5 mL via ORAL
  Filled 2019-10-28 (×3): qty 5

## 2019-10-28 MED ORDER — METHYLPREDNISOLONE SODIUM SUCC 40 MG IJ SOLR
40.0000 mg | Freq: Two times a day (BID) | INTRAMUSCULAR | Status: DC
  Administered 2019-10-28 – 2019-10-29 (×3): 40 mg via INTRAVENOUS
  Filled 2019-10-28 (×3): qty 1

## 2019-10-28 MED ORDER — HYDRALAZINE HCL 50 MG PO TABS
25.0000 mg | ORAL_TABLET | Freq: Four times a day (QID) | ORAL | Status: DC | PRN
  Administered 2019-10-28: 25 mg via ORAL
  Filled 2019-10-28: qty 1

## 2019-10-28 MED ORDER — ADULT MULTIVITAMIN W/MINERALS CH
1.0000 | ORAL_TABLET | Freq: Every day | ORAL | Status: DC
  Administered 2019-10-28 – 2019-10-29 (×2): 1 via ORAL
  Filled 2019-10-28 (×2): qty 1

## 2019-10-28 MED ORDER — AMLODIPINE BESYLATE 10 MG PO TABS
10.0000 mg | ORAL_TABLET | Freq: Every day | ORAL | Status: DC
  Administered 2019-10-28 – 2019-10-29 (×2): 10 mg via ORAL
  Filled 2019-10-28 (×2): qty 1

## 2019-10-28 NOTE — Plan of Care (Signed)
Pt states he feel infinitely better today than yesterday.  BP has been elevated. Gave hydralazine PRN, now scheduled.  Also now a finger stick b/c of steroids.   Tussionex ordered for cough.  Pt has been up to BR independently.

## 2019-10-28 NOTE — Progress Notes (Signed)
Initial Nutrition Assessment  RD working remotely.  DOCUMENTATION CODES:   Morbid obesity  INTERVENTION:   -Continue 500 mg vitamin C daily -Continue 220 mg zinc sulfate daily -Ensure Max po BID, each supplement provides 150 kcal and 30 grams of protein.  -MVI with minerals daily  NUTRITION DIAGNOSIS:   Increased nutrient needs related to acute illness(COVID-19) as evidenced by estimated needs.  GOAL:   Patient will meet greater than or equal to 90% of their needs  MONITOR:   PO intake, Supplement acceptance, Weight trends, Labs, Skin, I & O's  REASON FOR ASSESSMENT:   Malnutrition Screening Tool    ASSESSMENT:   Brandon Ford is a 44 y.o. male with medical history significant of hypertension, who presents with cough, shortness of breath.  Pt admitted with acute respiratory distress secondary to COVID-19.   Reviewed I/O's: +250 ml x 24 hours  Attempted to speak with pt via phone, however, no answer. RD unable to obtain further nutrition-related history at this time.   Pt with good oral intake; noted meal completion 100%.   Reviewed wt hx; noted distant history of weight gain.   Pt with increased nutritional needs related to COVID-19 and would benefit from addition of oral nutrition supplements.   Labs reviewed.   Diet Order:   Diet Order            Diet Heart Room service appropriate? Yes; Fluid consistency: Thin  Diet effective now              EDUCATION NEEDS:   No education needs have been identified at this time  Skin:     Last BM:     Height:   Ht Readings from Last 1 Encounters:  10/27/19 5\' 10"  (1.778 m)    Weight:   Wt Readings from Last 1 Encounters:  10/27/19 (!) 163.3 kg    Ideal Body Weight:  75.5 kg  BMI:  Body mass index is 51.65 kg/m.  Estimated Nutritional Needs:   Kcal:  2250-2450  Protein:  150-175 grams  Fluid:  > 2.2 L    Elianys Conry A. Jimmye Norman, RD, LDN, [place] Registered Dietitian II Certified Diabetes  Care and Education Specialist Pager: [phone] After hours Pager: [phone]

## 2019-10-28 NOTE — Progress Notes (Signed)
PROGRESS NOTE                                                                                                                                                                                                             Patient Demographics:    Brandon Ford, is a 44 y.o. male, DOB - 28-May-1976, [id number]  Admit date - 10/27/2019   Admitting Physician Ivor Costa, MD  Outpatient Primary MD for the patient is Kirk Ruths, MD  LOS - 1    Chief Complaint  Patient presents with  . Weakness       Brief Narrative 44 year old morbidly obese male with history of hypertension presented with increasing cough with shortness of breath.  He was tested positive for COVID-19 on 1/15, had minimal symptoms at that time but has continued to feel worse with cough, shortness of breath, generalized weakness with intermittent fever and chills.  Also had diarrhea for past 6 days with no nausea or vomiting. In the ED he had temperature of 99.20F, tachycardic, O2 sat 99% on room air, AKI with creatinine of 1.28 and potassium of 3.2.  Chest x-ray showed borderline cardiomegaly with bilateral interstitial infiltrate. Admitted for COVID-19 pneumonia   Subjective:   Patient reports still having some cough and dyspneic on minimal exertion.  Has not been out of bed.   Assessment  & Plan :    Principal Problem:   Acute respiratory disease due to COVID-19 virus Maintaining sat at 90-92% on room air.  Has not ambulated yet and I suspect he will desaturate with exertion.  Continue airborne and contact precaution.  Continue IV remdesivir (day 2/5) and IV Solu-Medrol (day 2/10).  Empiric vitamin C, zinc, as needed Mucinex and bronchodilators. Follow blood culture. Monitor daily inflammatory markers.  (CRP, fibrin derivative D-dimer and ferritin improving in a.m. lab).    Active Problems: Uncontrolled hypertension Amlodipine dose increased  and added as needed hydralazine.    Hypokalemia Secondary to diarrhea.  Replenished.     Diarrhea Secondary to COVID-19 infection.  Currently resolved.  Continue hydration   Morbid obesity (BMI 51.65 kg/m) Is counseled on diet and exercise to lose weight  Hyperglycemia Secondary to steroid use.  Monitor on sliding scale coverage.   Code Status : Full code  Family Communication  : None  Disposition Plan  : Home  once clinically improved (possibly next 48 hours if no longer hypoxic and can arrange outpatient remdesivir infusion)  Barriers For Discharge : Active symptoms  Consults  : None  Procedures  : None  DVT Prophylaxis  :  Lovenox  Lab Results  Component Value Date   PLT 142 (L) 10/28/2019    Antibiotics  :   Anti-infectives (From admission, onward)   Start     Dose/Rate Route Frequency Ordered Stop   10/28/19 1000  remdesivir 100 mg in sodium chloride 0.9 % 100 mL IVPB     100 mg 200 mL/hr over 30 Minutes Intravenous Daily 10/27/19 1331 11/01/19 0959   10/27/19 1400  remdesivir 200 mg in sodium chloride 0.9% 250 mL IVPB     200 mg 580 mL/hr over 30 Minutes Intravenous Once 10/27/19 1331 10/27/19 1611        Objective:   Vitals:   10/28/19 0826 10/28/19 0900 10/28/19 1011 10/28/19 1135  BP: 131/88 (!) 154/105 (!) 167/109   Pulse: 98 95 91   Resp: (!) 21 (!) 27 (!) 22 (!) 21  Temp: 98.1 F (36.7 C) 98.1 F (36.7 C)    TempSrc: Oral Oral    SpO2: 92% 90% 91%   Weight:      Height:        Wt Readings from Last 3 Encounters:  10/27/19 (!) 163.3 kg  04/04/16 (!) 154.2 kg  09/26/15 (!) 147.4 kg     Intake/Output Summary (Last 24 hours) at 10/28/2019 1247 Last data filed at 10/28/2019 0900 Gross per 24 hour  Intake 730 ml  Output --  Net 730 ml     Physical Exam  Gen: not in distress HEENT: moist mucosa, supple neck Chest: Coarse breath sounds bilaterally CVS: N S1&S2, no murmurs,  GI: soft, NT, ND,  Musculoskeletal: warm, no  edema     Data Review:    CBC Recent Labs  Lab 10/27/19 1222 10/28/19 0509  WBC 4.6 2.8*  HGB 15.8 15.7  HCT 43.6 43.8  PLT 141* 142*  MCV 84.5 85.5  MCH 30.6 30.7  MCHC 36.2* 35.8  RDW 12.7 12.8  LYMPHSABS  --  0.4*  MONOABS  --  0.2  EOSABS  --  0.0  BASOSABS  --  0.0    Chemistries  Recent Labs  Lab 10/27/19 1222 10/27/19 1938 10/28/19 0509  NA 134*  --  138  K 3.2*  --  3.8  CL 102  --  106  CO2 20*  --  21*  GLUCOSE 135*  --  166*  BUN 17  --  23*  CREATININE 1.28*  --  1.24  CALCIUM 8.6*  --  8.7*  MG  --   --  2.2  AST  --  38 33  ALT  --  31 30  ALKPHOS  --  30* 28*  BILITOT  --  1.0 1.0   ------------------------------------------------------------------------------------------------------------------ Recent Labs    10/27/19 1305  TRIG 88    No results found for: HGBA1C ------------------------------------------------------------------------------------------------------------------ No results for input(s): TSH, T4TOTAL, T3FREE, THYROIDAB in the last 72 hours.  Invalid input(s): FREET3 ------------------------------------------------------------------------------------------------------------------ Recent Labs    10/27/19 1305 10/28/19 0509  FERRITIN 805* 711*    Coagulation profile No results for input(s): INR, PROTIME in the last 168 hours.  No results for input(s): DDIMER in the last 72 hours.  Cardiac Enzymes No results for input(s): CKMB, TROPONINI, MYOGLOBIN in the last 168 hours.  Invalid input(s): CK ------------------------------------------------------------------------------------------------------------------  Component Value Date/Time   BNP 26.0 10/27/2019 1938    Inpatient Medications  Scheduled Meds: . amLODipine  10 mg Oral Daily  . vitamin C  500 mg Oral Daily  . dextromethorphan-guaiFENesin  1 tablet Oral BID  . enoxaparin (LOVENOX) injection  80 mg Subcutaneous Q24H  . influenza vac split  quadrivalent PF  0.5 mL Intramuscular Tomorrow-1000  . ipratropium  2 puff Inhalation Q4H  . methylPREDNISolone (SOLU-MEDROL) injection  40 mg Intravenous Q12H  . zinc sulfate  220 mg Oral Daily   Continuous Infusions: . remdesivir 100 mg in NS 100 mL Stopped (10/28/19 1135)   PRN Meds:.acetaminophen, albuterol, hydrALAZINE, ondansetron (ZOFRAN) IV  Micro Results Recent Results (from the past 240 hour(s))  Blood Culture (routine x 2)     Status: None (Preliminary result)   Collection Time: 10/27/19  1:05 PM   Specimen: BLOOD  Result Value Ref Range Status   Specimen Description BLOOD BLOOD RIGHT HAND  Final   Special Requests   Final    BOTTLES DRAWN AEROBIC AND ANAEROBIC Blood Culture adequate volume   Culture   Final    NO GROWTH < 24 HOURS Performed at [facility], [address] [place], [place] [address]    Report Status PENDING  Incomplete  Blood Culture (routine x 2)     Status: None (Preliminary result)   Collection Time: 10/27/19  1:10 PM   Specimen: BLOOD  Result Value Ref Range Status   Specimen Description BLOOD LEFT ANTECUBITAL  Final   Special Requests   Final    BOTTLES DRAWN AEROBIC AND ANAEROBIC Blood Culture adequate volume   Culture   Final    NO GROWTH < 24 HOURS Performed at [facility], [address] [place], [place] [address]    Report Status PENDING  Incomplete    Radiology Reports DG Chest Port 1 View  Result Date: 10/27/2019 CLINICAL DATA:  Weakness.  Shortness of breath.  COVID-19. EXAM: PORTABLE CHEST 1 VIEW COMPARISON:  01/21/1996 report. FINDINGS: Line cardiomegaly. Diffuse severe bilateral interstitial prominence noted. Interstitial edema and/or pneumonitis could present this fashion. Low lung volumes. No pleural effusion or pneumothorax. IMPRESSION: 1.  Borderline cardiomegaly. 2. Diffuse severe bilateral interstitial prominence. Interstitial edema and/or pneumonitis could present in this fashion in this known COVID-19  positive patient. Electronically Signed   By: Marcello Moores  Register   On: 10/27/2019 13:05    Time Spent in minutes 35   Ardenia Stiner M.D on 10/28/2019 at 12:47 PM  Between 7am to 7pm - Pager - [phone]  After 7pm go to [email or web address] - password [facility]  [facility] -  Office  [phone]

## 2019-10-29 DIAGNOSIS — I10 Essential (primary) hypertension: Secondary | ICD-10-CM | POA: Diagnosis present

## 2019-10-29 DIAGNOSIS — R197 Diarrhea, unspecified: Secondary | ICD-10-CM

## 2019-10-29 DIAGNOSIS — J1282 Pneumonia due to coronavirus disease 2019: Secondary | ICD-10-CM

## 2019-10-29 DIAGNOSIS — Z6841 Body Mass Index (BMI) 40.0 and over, adult: Secondary | ICD-10-CM

## 2019-10-29 LAB — COMPREHENSIVE METABOLIC PANEL
ALT: 25 U/L (ref 0–44)
AST: 21 U/L (ref 15–41)
Albumin: 3.3 g/dL — ABNORMAL LOW (ref 3.5–5.0)
Alkaline Phosphatase: 30 U/L — ABNORMAL LOW (ref 38–126)
Anion gap: 10 (ref 5–15)
BUN: 33 mg/dL — ABNORMAL HIGH (ref 6–20)
CO2: 21 mmol/L — ABNORMAL LOW (ref 22–32)
Calcium: 8.8 mg/dL — ABNORMAL LOW (ref 8.9–10.3)
Chloride: 106 mmol/L (ref 98–111)
Creatinine, Ser: 1.2 mg/dL (ref 0.61–1.24)
GFR calc Af Amer: >60 mL/min (ref >60)
GFR calc non Af Amer: >60 mL/min (ref >60)
Glucose, Bld: 171 mg/dL — ABNORMAL HIGH (ref 70–99)
Potassium: 4.1 mmol/L (ref 3.5–5.1)
Sodium: 137 mmol/L (ref 135–145)
Total Bilirubin: 1 mg/dL (ref 0.3–1.2)
Total Protein: 7 g/dL (ref 6.5–8.1)

## 2019-10-29 LAB — CBC WITH DIFFERENTIAL/PLATELET
Abs Immature Granulocytes: 0.05 10*3/uL (ref 0.00–0.07)
Basophils Absolute: 0 10*3/uL (ref 0.0–0.1)
Basophils Relative: 0 %
Eosinophils Absolute: 0 10*3/uL (ref 0.0–0.5)
Eosinophils Relative: 0 %
HCT: 43.4 % (ref 39.0–52.0)
Hemoglobin: 15.6 g/dL (ref 13.0–17.0)
Immature Granulocytes: 1 %
Lymphocytes Relative: 8 %
Lymphs Abs: 0.6 10*3/uL — ABNORMAL LOW (ref 0.7–4.0)
MCH: 30.9 pg (ref 26.0–34.0)
MCHC: 35.9 g/dL (ref 30.0–36.0)
MCV: 85.9 fL (ref 80.0–100.0)
Monocytes Absolute: 0.6 10*3/uL (ref 0.1–1.0)
Monocytes Relative: 8 %
Neutro Abs: 6.5 10*3/uL (ref 1.7–7.7)
Neutrophils Relative %: 83 %
Platelets: 171 10*3/uL (ref 150–400)
RBC: 5.05 MIL/uL (ref 4.22–5.81)
RDW: 12.9 % (ref 11.5–15.5)
WBC: 7.7 10*3/uL (ref 4.0–10.5)
nRBC: 0 % (ref 0.0–0.2)

## 2019-10-29 LAB — GLUCOSE, CAPILLARY
Glucose-Capillary: 141 mg/dL — ABNORMAL HIGH (ref 70–99)
Glucose-Capillary: 190 mg/dL — ABNORMAL HIGH (ref 70–99)

## 2019-10-29 LAB — MAGNESIUM: Magnesium: 2.2 mg/dL (ref 1.7–2.4)

## 2019-10-29 LAB — C-REACTIVE PROTEIN: CRP: 2.3 mg/dL — ABNORMAL HIGH (ref <1.0)

## 2019-10-29 LAB — FERRITIN: Ferritin: 829 ng/mL — ABNORMAL HIGH (ref 24–336)

## 2019-10-29 LAB — FIBRIN DERIVATIVES D-DIMER (ARMC ONLY): Fibrin derivatives D-dimer (ARMC): 389.87 ng/mL (FEU) (ref 0.00–499.00)

## 2019-10-29 MED ORDER — AMLODIPINE BESYLATE 5 MG PO TABS: 10.0000 mg | ORAL_TABLET | Freq: Every day | ORAL | 1 refills | Status: AC

## 2019-10-29 MED ORDER — ASCORBIC ACID 500 MG PO TABS: 500.0000 mg | ORAL_TABLET | Freq: Every day | ORAL | 0 refills | Status: AC

## 2019-10-29 MED ORDER — DM-GUAIFENESIN ER 30-600 MG PO TB12: 1.0000 | ORAL_TABLET | Freq: Two times a day (BID) | ORAL | 0 refills | Status: AC

## 2019-10-29 MED ORDER — PREDNISONE 50 MG PO TABS: 50.0000 mg | ORAL_TABLET | Freq: Every day | ORAL | 0 refills | Status: AC

## 2019-10-29 MED ORDER — ZINC SULFATE 220 (50 ZN) MG PO CAPS: 220.0000 mg | ORAL_CAPSULE | Freq: Every day | ORAL | 0 refills | Status: AC

## 2019-10-29 MED ORDER — ENSURE MAX PROTEIN PO LIQD: 11.0000 [oz_av] | Freq: Two times a day (BID) | ORAL | 12 refills | Status: AC

## 2019-10-29 NOTE — Progress Notes (Signed)
Patient scheduled for outpatient Remdesivir infusion at 1PM on Monday 1/25 and Tuesday 1/26.  Please advise them to report to [facility] at [address]  Drive to the security guard and tell them you are here for an infusion. They will direct you to the front entrance where we will come and get you.  For questions call [phone].  Thanks

## 2019-10-29 NOTE — Plan of Care (Signed)
  Problem: Education: Goal: Knowledge of risk factors and measures for prevention of condition will improve Outcome: Adequate for Discharge   

## 2019-10-29 NOTE — Progress Notes (Signed)
Pt discharged home via wife-no distress noted-instructions reviewed with patient-prescriptions given.

## 2019-10-29 NOTE — Discharge Instructions (Addendum)
COVID-19: How to Protect Yourself and Others Know how it spreads  There is currently no vaccine to prevent coronavirus disease 2019 (COVID-19).  The best way to prevent illness is to avoid being exposed to this virus.  The virus is thought to spread mainly from person-to-person. ? Between people who are in close contact with one another (within about 6 feet). ? Through respiratory droplets produced when an infected person coughs, sneezes or talks. ? These droplets can land in the mouths or noses of people who are nearby or possibly be inhaled into the lungs. ? COVID-19 may be spread by people who are not showing symptoms. Everyone should Clean your hands often  Wash your hands often with soap and water for at least 20 seconds especially after you have been in a public place, or after blowing your nose, coughing, or sneezing.  If soap and water are not readily available, use a hand sanitizer that contains at least 60% alcohol. Cover all surfaces of your hands and rub them together until they feel dry.  Avoid touching your eyes, nose, and mouth with unwashed hands. Avoid close contact  Limit contact with others as much as possible.  Avoid close contact with people who are sick.  Put distance between yourself and other people. ? Remember that some people without symptoms may be able to spread virus. ? This is especially important for people who are at higher risk of getting very [email or web address] Cover your mouth and nose with a mask when around others  You could spread COVID-19 to others even if you do not feel sick.  Everyone should wear a mask in public settings and when around people not living in their household, especially when social distancing is difficult to maintain. ? Masks should not be placed on young children under age 53, anyone who has trouble breathing, or is unconscious, incapacitated or otherwise  unable to remove the mask without assistance.  The mask is meant to protect other people in case you are infected.  Do NOT use a facemask meant for a Dietitian.  Continue to keep about 6 feet between yourself and others. The mask is not a substitute for social distancing. Cover coughs and sneezes  Always cover your mouth and nose with a tissue when you cough or sneeze or use the inside of your elbow.  Throw used tissues in the trash.  Immediately wash your hands with soap and water for at least 20 seconds. If soap and water are not readily available, clean your hands with a hand sanitizer that contains at least 60% alcohol. Clean and disinfect  Clean AND disinfect frequently touched surfaces daily. This includes tables, doorknobs, light switches, countertops, handles, desks, phones, keyboards, toilets, faucets, and sinks. [email or web address]  If surfaces are dirty, clean them: Use detergent or soap and water prior to disinfection.  Then, use a household disinfectant. You can see a list of EPA-registered household disinfectants here. [email or web address] 06/07/2019 This information is not intended to replace advice given to you by your health care provider. Make sure you discuss any questions you have with your health care provider. Document Revised: 06/15/2019 Document Reviewed: 04/13/2019 Elsevier Patient Education  [place].   Cough, Adult A cough helps to clear your throat and lungs. A cough may be a sign of an illness or another medical condition. An acute cough may only last 2-3 weeks, while a chronic cough may last 8 or more weeks. Many things can cause a cough. They include:  Germs (viruses or bacteria) that attack the airway.  Breathing in things that bother (irritate) your lungs.  Allergies.  Asthma.  Mucus that runs down the back of your throat (postnasal drip).  Smoking.  Acid  backing up from the stomach into the tube that moves food from the mouth to the stomach (gastroesophageal reflux).  Some medicines.  Lung problems.  Other medical conditions, such as heart failure or a blood clot in the lung (pulmonary embolism). Follow these instructions at home: Medicines  Take over-the-counter and prescription medicines only as told by your doctor.  Talk with your doctor before you take medicines that stop a cough (coughsuppressants). Lifestyle   Do not smoke, and try not to be around smoke. Do not use any products that contain nicotine or tobacco, such as cigarettes, e-cigarettes, and chewing tobacco. If you need help quitting, ask your doctor.  Drink enough fluid to keep your pee (urine) pale yellow.  Avoid caffeine.  Do not drink alcohol if your doctor tells you not to drink. General instructions   Watch for any changes in your cough. Tell your doctor about them.  Always cover your mouth when you cough.  Stay away from things that make you cough, such as perfume, candles, campfire smoke, or cleaning products.  If the air is dry, use a cool mist vaporizer or humidifier in your home.  If your cough is worse at night, try using extra pillows to raise your head up higher while you sleep.  Rest as needed.  Keep all follow-up visits as told by your doctor. This is important. Contact a doctor if:  You have new symptoms.  You cough up pus.  Your cough does not get better after 2-3 weeks, or your cough gets worse.  Cough medicine does not help your cough and you are not sleeping well.  You have pain that gets worse or pain that is not helped with medicine.  You have a fever.  You are losing weight and you do not know why.  You have night sweats. Get help right away if:  You cough up blood.  You have trouble breathing.  Your heartbeat is very fast. These symptoms may be an emergency. Do not wait to see if the symptoms will go away. Get  medical help right away. Call your local emergency services (911 in the [place] Do not drive yourself to the hospital. Summary  A cough helps to clear your throat and lungs. Many things can cause a cough.  Take over-the-counter and prescription medicines only as told by your doctor.  Always cover your mouth when you cough.  Contact a doctor if you have new symptoms or you have a cough that does not get better or gets worse. This information is not intended to replace advice given to you by your health care provider. Make sure you discuss any questions you have with your health care provider. Document Revised: 10/10/2018 Document Reviewed: 10/10/2018 Elsevier Patient Education  [place].   Prone Position Therapy  Prone position therapy, also called prone positioning, is when a person is put on his or her stomach to make breathing easier. This therapy usually takes place in a hospital intensive care unit (ICU). Most people will be asleep during the therapy, but sometimes, the person is awake. During this therapy, the person will be on his or her stomach for as long as possible. This may be for 16 or more hours each day. Often, this therapy is used when  a person:  Has acute respiratory distress syndrome (ARDS). This condition is very serious because fluid collects in the lungs. When this happens, the lungs cannot fill with air like normal and oxygen cannot pass to the blood.  Requires a ventilator to help him or her breathe. A ventilator is a machine that moves air in and out of the lungs. Prone position therapy may make it easier for oxygen to pass to the blood in the lungs. It may also prevent injury to the lungs from being on a ventilator. What are the risks? The risks may include:  Tubes becoming disconnected or blocked. Examples include breathing tubes, drains, or IVs.  Pressure injuries to the skin and tissue. This happens when skin presses against a surface, such as a  mattress, for too long.  Nerve injuries from being in one position for a long time.  Buildup of fluid in the face.  Inhaling food, saliva, or liquid into the lungs (aspirating).  Not responding to this therapy and the condition gets worse instead of better. What happens before the treatment? Your health care provider will make sure it is safe to perform the therapy. To do this, your health care provider may check:  Your blood pressure, heart rate, breathing rate, and blood oxygen level.  Your skin. A protective barrier will be applied to your face and any areas that may have a lot of pressure during therapy.  Your level of alertness and pain.  Any tubes connected to you. All lines, tubes, and drains will be secured. You may be given medicines to:  Help you relax (sedatives).  Control blood pressure.  Control pain. What happens during the treatment?  Your health care providers, or a special moving bed, will lift and turn you onto your stomach.  Certain bedding will be used to help prevent pressure injuries. This may include a pillow, pad, mattress, or cushion that is filled with gel, air, water, or foam.  Your head, arms, and legs will be moved into different positions every hour.  Any medical devices and braces will be adjusted as needed to limit pressure on your skin. Your health care providers may also place padding between the skin and the device.  Your health care providers will regularly check: ? Your skin for swelling and signs of pressure injuries. ? Your breathing tubes, drains, or IVs to make sure these are still in place and not blocked. ? Your blood to measure the levels of oxygen and carbon dioxide (arterial blood gases test). This test tells your health care provider if the therapy is helping. ? Your blood pressure, heart rate, breathing rate, and blood oxygen level. ? Your ventilator to make sure that all connections are secure and that the machine is working  correctly. Ventilators have alarms that go off if something needs to be checked. The procedure may vary among health care providers and hospitals. What can I expect after the treatment?  You will be moved from your stomach to your back.  More arterial blood gases tests will be done to make sure the treatment helped.  Your blood pressure, heart rate, breathing rate, and blood oxygen level will be monitored. Summary  Prone position therapy, also called prone positioning, is when a person is put on his or her stomach to make breathing easier.  Prone position therapy may make it easier for oxygen to pass to the blood in the lungs. It may also prevent injury to the lungs from the ventilator.  Your  health care providers, or a special moving bed, will lift and turn you onto your stomach. This information is not intended to replace advice given to you by your health care provider. Make sure you discuss any questions you have with your health care provider. Document Revised: 06/09/2019 Document Reviewed: 06/09/2019 Elsevier Patient Education  [place].   Prone Position Therapy  Prone position therapy, also called prone positioning, is when a person is put on his or her stomach to make breathing easier. This therapy usually takes place in a hospital intensive care unit (ICU). Most people will be asleep during the therapy, but sometimes, the person is awake. During this therapy, the person will be on his or her stomach for as long as possible. This may be for 16 or more hours each day. Often, this therapy is used when a person:  Has acute respiratory distress syndrome (ARDS). This condition is very serious because fluid collects in the lungs. When this happens, the lungs cannot fill with air like normal and oxygen cannot pass to the blood.  Requires a ventilator to help him or her breathe. A ventilator is a machine that moves air in and out of the lungs. Prone position therapy may make it  easier for oxygen to pass to the blood in the lungs. It may also prevent injury to the lungs from being on a ventilator. What are the risks? The risks may include:  Tubes becoming disconnected or blocked. Examples include breathing tubes, drains, or IVs.  Pressure injuries to the skin and tissue. This happens when skin presses against a surface, such as a mattress, for too long.  Nerve injuries from being in one position for a long time.  Buildup of fluid in the face.  Inhaling food, saliva, or liquid into the lungs (aspirating).  Not responding to this therapy and the condition gets worse instead of better. What happens before the treatment? Your health care provider will make sure it is safe to perform the therapy. To do this, your health care provider may check:  Your blood pressure, heart rate, breathing rate, and blood oxygen level.  Your skin. A protective barrier will be applied to your face and any areas that may have a lot of pressure during therapy.  Your level of alertness and pain.  Any tubes connected to you. All lines, tubes, and drains will be secured. You may be given medicines to:  Help you relax (sedatives).  Control blood pressure.  Control pain. What happens during the treatment?  Your health care providers, or a special moving bed, will lift and turn you onto your stomach.  Certain bedding will be used to help prevent pressure injuries. This may include a pillow, pad, mattress, or cushion that is filled with gel, air, water, or foam.  Your head, arms, and legs will be moved into different positions every hour.  Any medical devices and braces will be adjusted as needed to limit pressure on your skin. Your health care providers may also place padding between the skin and the device.  Your health care providers will regularly check: ? Your skin for swelling and signs of pressure injuries. ? Your breathing tubes, drains, or IVs to make sure these are still  in place and not blocked. ? Your blood to measure the levels of oxygen and carbon dioxide (arterial blood gases test). This test tells your health care provider if the therapy is helping. ? Your blood pressure, heart rate, breathing rate, and blood  oxygen level. ? Your ventilator to make sure that all connections are secure and that the machine is working correctly. Ventilators have alarms that go off if something needs to be checked. The procedure may vary among health care providers and hospitals. What can I expect after the treatment?  You will be moved from your stomach to your back.  More arterial blood gases tests will be done to make sure the treatment helped.  Your blood pressure, heart rate, breathing rate, and blood oxygen level will be monitored. Summary  Prone position therapy, also called prone positioning, is when a person is put on his or her stomach to make breathing easier.  Prone position therapy may make it easier for oxygen to pass to the blood in the lungs. It may also prevent injury to the lungs from the ventilator.  Your health care providers, or a special moving bed, will lift and turn you onto your stomach. This information is not intended to replace advice given to you by your health care provider. Make sure you discuss any questions you have with your health care provider. Document Revised: 06/09/2019 Document Reviewed: 06/09/2019 Elsevier Patient Education  2020 [place] are scheduled for an outpatient infusion of Remdesivir at [facility] on Monday 1/25 and Tuesday 1/26.Marland Kitchen  Please report to [facility] at [address].  Drive to the security guard and tell them you are here for an infusion. They will direct you to the front entrance where we will come and get you.  For questions call [phone].  Thanks     COVID-19: How to Protect Yourself and Others Know how it spreads  There is currently no vaccine to prevent coronavirus disease 2019  (COVID-19).  The best way to prevent illness is to avoid being exposed to this virus.  The virus is thought to spread mainly from person-to-person. ? Between people who are in close contact with one another (within about 6 feet). ? Through respiratory droplets produced when an infected person coughs, sneezes or talks. ? These droplets can land in the mouths or noses of people who are nearby or possibly be inhaled into the lungs. ? COVID-19 may be spread by people who are not showing symptoms. Everyone should Clean your hands often  Wash your hands often with soap and water for at least 20 seconds especially after you have been in a public place, or after blowing your nose, coughing, or sneezing.  If soap and water are not readily available, use a hand sanitizer that contains at least 60% alcohol. Cover all surfaces of your hands and rub them together until they feel dry.  Avoid touching your eyes, nose, and mouth with unwashed hands. Avoid close contact  Limit contact with others as much as possible.  Avoid close contact with people who are sick.  Put distance between yourself and other people. ? Remember that some people without symptoms may be able to spread virus. ? This is especially important for people who are at higher risk of getting very [email or web address] Cover your mouth and nose with a mask when around others  You could spread COVID-19 to others even if you do not feel sick.  Everyone should wear a mask in public settings and when around people not living in their household, especially when social distancing is difficult to maintain. ? Masks should not be placed on young children under age 61, anyone who has trouble breathing, or is unconscious, incapacitated or otherwise unable to remove  the mask without assistance.  The mask is meant to protect other people in case you are infected.  Do NOT use a  facemask meant for a Dietitian.  Continue to keep about 6 feet between yourself and others. The mask is not a substitute for social distancing. Cover coughs and sneezes  Always cover your mouth and nose with a tissue when you cough or sneeze or use the inside of your elbow.  Throw used tissues in the trash.  Immediately wash your hands with soap and water for at least 20 seconds. If soap and water are not readily available, clean your hands with a hand sanitizer that contains at least 60% alcohol. Clean and disinfect  Clean AND disinfect frequently touched surfaces daily. This includes tables, doorknobs, light switches, countertops, handles, desks, phones, keyboards, toilets, faucets, and sinks. [email or web address]  If surfaces are dirty, clean them: Use detergent or soap and water prior to disinfection.  Then, use a household disinfectant. You can see a list of EPA-registered household disinfectants here. [email or web address] 06/07/2019 This information is not intended to replace advice given to you by your health care provider. Make sure you discuss any questions you have with your health care provider. Document Revised: 06/15/2019 Document Reviewed: 04/13/2019 Elsevier Patient Education  [place]: Quarantine vs. Isolation QUARANTINE keeps someone who was in close contact with someone who has COVID-19 away from others. If you had close contact with a person who has COVID-19  Stay home until 14 days after your last contact.  Check your temperature twice a day and watch for symptoms of COVID-19.  If possible, stay away from people who are at higher-risk for getting very sick from COVID-19. ISOLATION keeps someone who is sick or tested positive for COVID-19 without symptoms away from others, even in their own home. If you are sick and think or know you have COVID-19  Stay home until  after ? At least 10 days since symptoms first appeared and ? At least 24 hours with no fever without fever-reducing medication and ? Symptoms have improved If you tested positive for COVID-19 but do not have symptoms  Stay home until after ? 10 days have passed since your positive test If you live with others, stay in a specific "sick room" or area and away from other people or animals, including pets. Use a separate bathroom, if available. [email or web address] 04/24/2019 This information is not intended to replace advice given to you by your health care provider. Make sure you discuss any questions you have with your health care provider. Document Revised: 09/07/2019 Document Reviewed: 09/07/2019 Elsevier Patient Education  [place].

## 2019-10-29 NOTE — Discharge Summary (Signed)
Physician Discharge Summary  Brandon Ford [id number] DOB: Feb 03, 1976 DOA: 10/27/2019  PCP: Kirk Ruths, MD  Admit date: 10/27/2019 Discharge date: 10/29/2019  Admitted From: Home Disposition: Home  Recommendations for Outpatient Follow-up:  Patient will follow up at [facility] to receive 2 more days of IV remdesivir on 1/25 and 1/26.  Follow-up with PCP in 3 weeks.  Please adjust blood pressure medications during outpatient follow-up if elevated. Patient being discharged on 7 more days of oral prednisone.  Home Health: None Equipment/Devices: None  Discharge Condition: Fair CODE STATUS: Full code Diet recommendation: 2 g sodium    Discharge Diagnoses:  Principal Problem:   Acute respiratory disease due to COVID-19 virus   Active Problems:   Hypokalemia   Diarrhea   Uncontrolled hypertension   Morbid obesity with BMI of 50.0-59.9, adult ([place])  Brief narrative/HPI 44 year old morbidly obese male with history of hypertension presented with increasing cough with shortness of breath.  He was tested positive for COVID-19 on 1/15, had minimal symptoms at that time but has continued to feel worse with cough, shortness of breath, generalized weakness with intermittent fever and chills.  Also had diarrhea for past 6 days with no nausea or vomiting. In the ED he had temperature of 99.30F, tachycardic, O2 sat 99% on room air, AKI with creatinine of 1.28 and potassium of 3.2.  Chest x-ray showed borderline cardiomegaly with bilateral interstitial infiltrate. Admitted for COVID-19 pneumonia.  Hospital course    Principal Problem:   Acute respiratory disease due to COVID-19 virus Patient maintaining sats in the low 90s on room air and on ambulation. Maintain on airborne and contact abortion. Received IV remdesivir (day 3/5) and IV Solu-Medrol (day 3/10).  Empiric vitamin C, zinc, antitussives and bronchodilators.  Blood cultures negative. Inflammatory markers  improving in a.m. lab. Patient continues to feel better with his dyspnea and cough significantly improved.  I have scheduled outpatient remdesivir infusion for the next 2 days (1/25 and 1/26) at 1 PM at [facility].  Detailed home health instructions including monitoring of any worsening symptoms, isolation/quarantine provided.  Active Problems: Uncontrolled hypertension Amlodipine dose increased to 10 mg daily.  Needs further adjustment during outpatient follow-up if elevated.    Hypokalemia Secondary to diarrhea.  Replenished.     Diarrhea Secondary to COVID-19 infection.    Resolved.   Morbid obesity (BMI 51.65 kg/m) counseled on diet and exercise to lose weight  Hyperglycemia Secondary to steroid use.    Stable     Family Communication  : None  Disposition Plan  :  Home  Consults  : None  Procedures  : None   Discharge Instructions   Allergies as of 10/29/2019   No Known Allergies     Medication List    TAKE these medications   acetaminophen 500 MG tablet Commonly known as: TYLENOL Take 1,000 mg by mouth every 6 (six) hours as needed.   amLODipine 5 MG tablet Commonly known as: NORVASC Take 2 tablets (10 mg total) by mouth daily. Start taking on: October 30, 2019   ascorbic acid 500 MG tablet Commonly known as: VITAMIN C Take 1 tablet (500 mg total) by mouth daily. Start taking on: October 30, 2019   dextromethorphan-guaiFENesin 30-600 MG 12hr tablet Commonly known as: MUCINEX DM Take 1 tablet by mouth 2 (two) times daily.   Ensure Max Protein Liqd Take 330 mLs (11 oz total) by mouth 2 (two) times daily.   predniSONE 50 MG tablet Commonly  known as: DELTASONE Take 1 tablet (50 mg total) by mouth daily for 7 days. Start taking on: October 30, 2019   zinc sulfate 220 (50 Zn) MG capsule Take 1 capsule (220 mg total) by mouth daily. Start taking on: October 30, 2019      Follow-up Information    Kirk Ruths, MD Follow up in 3 week(s).   Specialty: Internal Medicine Contact information: [place] [id number] [phone]          No Known Allergies      Procedures/Studies: [facility] Chest Port 1 View  Result Date: 10/27/2019 CLINICAL DATA:  Weakness.  Shortness of breath.  COVID-19. EXAM: PORTABLE CHEST 1 VIEW COMPARISON:  01/21/1996 report. FINDINGS: Line cardiomegaly. Diffuse severe bilateral interstitial prominence noted. Interstitial edema and/or pneumonitis could present this fashion. Low lung volumes. No pleural effusion or pneumothorax. IMPRESSION: 1.  Borderline cardiomegaly. 2. Diffuse severe bilateral interstitial prominence. Interstitial edema and/or pneumonitis could present in this fashion in this known COVID-19 positive patient. Electronically Signed   By: Marcello Moores  Register   On: 10/27/2019 13:05     Subjective: Breathing much better.  Cough improved.  Discharge Exam: Vitals:   10/28/19 1500 10/29/19 0012  BP: (!) 140/95   Pulse: 86   Resp:    Temp:  97.7 F (36.5 C)  SpO2:     Vitals:   10/28/19 1135 10/28/19 1305 10/28/19 1500 10/29/19 0012  BP:  (!) 167/113 (!) 140/95   Pulse:   86   Resp: (!) 21     Temp:    97.7 F (36.5 C)  TempSrc:    Oral  SpO2:      Weight:      Height:        General: Middle-aged obese male not in distress HEENT: Moist mucosa, supple neck Chest: Clear CVs: Normal S1-S2 GI: Soft, nondistended, nontender Musculoskeletal: Warm, no edema    The results of significant diagnostics from this hospitalization (including imaging, microbiology, ancillary and laboratory) are listed below for reference.     Microbiology: Recent Results (from the past 240 hour(s))  Blood Culture (routine x 2)     Status: None (Preliminary result)   Collection Time: 10/27/19  1:05 PM   Specimen: BLOOD  Result Value Ref Range Status   Specimen Description BLOOD BLOOD RIGHT HAND  Final   Special Requests   Final    BOTTLES DRAWN  AEROBIC AND ANAEROBIC Blood Culture adequate volume   Culture   Final    NO GROWTH 2 DAYS Performed at [facility], [address], [place], [place] [address]    Report Status PENDING  Incomplete  Blood Culture (routine x 2)     Status: None (Preliminary result)   Collection Time: 10/27/19  1:10 PM   Specimen: BLOOD  Result Value Ref Range Status   Specimen Description BLOOD LEFT ANTECUBITAL  Final   Special Requests   Final    BOTTLES DRAWN AEROBIC AND ANAEROBIC Blood Culture adequate volume   Culture   Final    NO GROWTH 2 DAYS Performed at [facility], [address], [place],  [address]    Report Status PENDING  Incomplete     Labs: BNP (last 3 results) Recent Labs    10/27/19 1938  BNP [id number]   Basic Metabolic Panel: Recent Labs  Lab 10/27/19 1222 10/28/19 0509 10/29/19 0517  NA 134* 138 137  K 3.2* 3.8 4.1  CL 102 106  106  CO2 20* 21* 21*  GLUCOSE 135* 166* 171*  BUN 17 23* 33*  CREATININE 1.28* 1.24 1.20  CALCIUM 8.6* 8.7* 8.8*  MG  --  2.2 2.2   Liver Function Tests: Recent Labs  Lab 10/27/19 1938 10/28/19 0509 10/29/19 0517  AST 38 33 21  ALT 31 30 25   ALKPHOS 30* 28* 30*  BILITOT 1.0 1.0 1.0  PROT 7.3 7.4 7.0  ALBUMIN 3.6 3.4* 3.3*   No results for input(s): LIPASE, AMYLASE in the last 168 hours. No results for input(s): AMMONIA in the last 168 hours. CBC: Recent Labs  Lab 10/27/19 1222 10/28/19 0509 10/29/19 0517  WBC 4.6 2.8* 7.7  NEUTROABS  --  2.2 6.5  HGB 15.8 15.7 15.6  HCT 43.6 43.8 43.4  MCV 84.5 85.5 85.9  PLT 141* 142* 171   Cardiac Enzymes: No results for input(s): CKTOTAL, CKMB, CKMBINDEX, TROPONINI in the last 168 hours. BNP: Invalid input(s): POCBNP CBG: Recent Labs  Lab 10/27/19 1232 10/28/19 1626 10/28/19 2043 10/29/19 0808  GLUCAP 128* 124* 185* 141*   D-Dimer No results for input(s): DDIMER in the last 72 hours. Hgb A1c Recent Labs    10/28/19 0509  HGBA1C 4.7*   Lipid  Profile Recent Labs    10/27/19 1305  TRIG 88   Thyroid function studies No results for input(s): TSH, T4TOTAL, T3FREE, THYROIDAB in the last 72 hours.  Invalid input(s): FREET3 Anemia work up Recent Labs    10/28/19 0509 10/29/19 0517  FERRITIN 711* 829*   Urinalysis    Component Value Date/Time   COLORURINE YELLOW (A) 09/19/2015 1421   APPEARANCEUR CLEAR (A) 09/19/2015 1421   LABSPEC 1.012 09/19/2015 1421   PHURINE 5.0 09/19/2015 1421   GLUCOSEU NEGATIVE 09/19/2015 1421   HGBUR NEGATIVE 09/19/2015 1421   BILIRUBINUR NEGATIVE 09/19/2015 1421   KETONESUR NEGATIVE 09/19/2015 1421   PROTEINUR 100 (A) 09/19/2015 1421   NITRITE NEGATIVE 09/19/2015 1421   LEUKOCYTESUR NEGATIVE 09/19/2015 1421   Sepsis Labs Invalid input(s): PROCALCITONIN,  WBC,  LACTICIDVEN Microbiology Recent Results (from the past 240 hour(s))  Blood Culture (routine x 2)     Status: None (Preliminary result)   Collection Time: 10/27/19  1:05 PM   Specimen: BLOOD  Result Value Ref Range Status   Specimen Description BLOOD BLOOD RIGHT HAND  Final   Special Requests   Final    BOTTLES DRAWN AEROBIC AND ANAEROBIC Blood Culture adequate volume   Culture   Final    NO GROWTH 2 DAYS Performed at [facility], [address] [place], [place] [address]    Report Status PENDING  Incomplete  Blood Culture (routine x 2)     Status: None (Preliminary result)   Collection Time: 10/27/19  1:10 PM   Specimen: BLOOD  Result Value Ref Range Status   Specimen Description BLOOD LEFT ANTECUBITAL  Final   Special Requests   Final    BOTTLES DRAWN AEROBIC AND ANAEROBIC Blood Culture adequate volume   Culture   Final    NO GROWTH 2 DAYS Performed at [facility], [address], [place], [place] [address]    Report Status PENDING  Incomplete     Time coordinating discharge: 35 minutes  SIGNED:   Louellen Molder, MD  [facility] 10/29/2019, 10:51 AM Pager   If 7PM-7AM, please  contact night-coverage [email or web address] Password [email or web address]

## 2019-10-30 ENCOUNTER — Ambulatory Visit (HOSPITAL_COMMUNITY)
Admission: RE | Admit: 2019-10-30 | Discharge: 2019-10-30 | Disposition: A | Discharge status: Home / Self Care | Payer: BC Managed Care – PPO | Source: Ambulatory Visit | Attending: Pulmonary Disease | Admitting: Pulmonary Disease

## 2019-10-30 DIAGNOSIS — U071 COVID-19: Secondary | ICD-10-CM | POA: Insufficient documentation

## 2019-10-30 DIAGNOSIS — J1289 Other viral pneumonia: Secondary | ICD-10-CM | POA: Diagnosis not present

## 2019-10-30 MED ORDER — FAMOTIDINE IN NACL 20-0.9 MG/50ML-% IV SOLN: 20.0000 mg | Freq: Once | INTRAVENOUS | Status: DC | PRN

## 2019-10-30 MED ORDER — METHYLPREDNISOLONE SODIUM SUCC 125 MG IJ SOLR: 125.0000 mg | Freq: Once | INTRAMUSCULAR | Status: DC | PRN

## 2019-10-30 MED ORDER — DIPHENHYDRAMINE HCL 50 MG/ML IJ SOLN: 50.0000 mg | Freq: Once | INTRAMUSCULAR | Status: DC | PRN

## 2019-10-30 MED ORDER — ALBUTEROL SULFATE HFA 108 (90 BASE) MCG/ACT IN AERS: 2.0000 | INHALATION_SPRAY | Freq: Once | RESPIRATORY_TRACT | Status: DC | PRN

## 2019-10-30 MED ORDER — SODIUM CHLORIDE 0.9 % IV SOLN: INTRAVENOUS | Status: DC | PRN

## 2019-10-30 MED ORDER — EPINEPHRINE 0.3 MG/0.3ML IJ SOAJ: 0.3000 mg | Freq: Once | INTRAMUSCULAR | Status: DC | PRN

## 2019-10-30 MED ORDER — SODIUM CHLORIDE 0.9 % IV SOLN
INTRAVENOUS | Status: AC
  Filled 2019-10-30: qty 20

## 2019-10-30 MED ORDER — SODIUM CHLORIDE 0.9 % IV SOLN
100.0000 mg | Freq: Once | INTRAVENOUS | Status: AC
  Administered 2019-10-30: 13:00:00 100 mg via INTRAVENOUS

## 2019-10-30 NOTE — Discharge Instructions (Signed)

## 2019-10-30 NOTE — Progress Notes (Signed)
  Diagnosis: COVID-19  Physician: Dr. Wright  Procedure: [facility] Med: remdesivir infusion.  Complications: No immediate complications noted.  Discharge: Discharged home   Brandon Ford A 10/30/2019  

## 2019-10-31 ENCOUNTER — Ambulatory Visit (HOSPITAL_COMMUNITY)
Admit: 2019-10-31 | Discharge: 2019-10-31 | Disposition: A | Discharge status: Home / Self Care | Payer: BC Managed Care – PPO | Attending: Pulmonary Disease | Admitting: Pulmonary Disease

## 2019-10-31 DIAGNOSIS — U071 COVID-19: Secondary | ICD-10-CM | POA: Diagnosis not present

## 2019-10-31 MED ORDER — EPINEPHRINE 0.3 MG/0.3ML IJ SOAJ: 0.3000 mg | Freq: Once | INTRAMUSCULAR | Status: DC | PRN

## 2019-10-31 MED ORDER — METHYLPREDNISOLONE SODIUM SUCC 125 MG IJ SOLR: 125.0000 mg | Freq: Once | INTRAMUSCULAR | Status: DC | PRN

## 2019-10-31 MED ORDER — ALBUTEROL SULFATE HFA 108 (90 BASE) MCG/ACT IN AERS: 2.0000 | INHALATION_SPRAY | Freq: Once | RESPIRATORY_TRACT | Status: DC | PRN

## 2019-10-31 MED ORDER — SODIUM CHLORIDE 0.9 % IV SOLN
100.0000 mg | Freq: Once | INTRAVENOUS | Status: AC
  Administered 2019-10-31: 100 mg via INTRAVENOUS

## 2019-10-31 MED ORDER — SODIUM CHLORIDE 0.9 % IV SOLN
INTRAVENOUS | Status: AC
  Filled 2019-10-31: qty 20

## 2019-10-31 MED ORDER — FAMOTIDINE IN NACL 20-0.9 MG/50ML-% IV SOLN: 20.0000 mg | Freq: Once | INTRAVENOUS | Status: DC | PRN

## 2019-10-31 MED ORDER — SODIUM CHLORIDE 0.9 % IV SOLN: INTRAVENOUS | Status: DC | PRN

## 2019-10-31 MED ORDER — DIPHENHYDRAMINE HCL 50 MG/ML IJ SOLN: 50.0000 mg | Freq: Once | INTRAMUSCULAR | Status: DC | PRN

## 2019-10-31 NOTE — Progress Notes (Signed)
  Diagnosis: COVID-19  Physician: Dr. Joya Gaskins  Procedure: [facility] Infusion Clinic Med: remdesivir infusion.  Complications: No immediate complications noted.  Discharge: Discharged home   Brandon Ford 10/31/2019

## 2019-11-01 LAB — CULTURE, BLOOD (ROUTINE X 2)
Culture: NO GROWTH
Culture: NO GROWTH
Special Requests: ADEQUATE
Special Requests: ADEQUATE

## 2019-11-06 ENCOUNTER — Other Ambulatory Visit: Payer: Self-pay

## 2020-07-28 IMAGING — DX DG CHEST 1V PORT
1 series · 1 of 1 positions shown · non-contrast
Comparison: 01/21/1996 report.

CLINICAL DATA: Weakness.  Shortness of breath.  O3NUP-RT.

EXAM:
PORTABLE CHEST 1 VIEW

[chest ap]
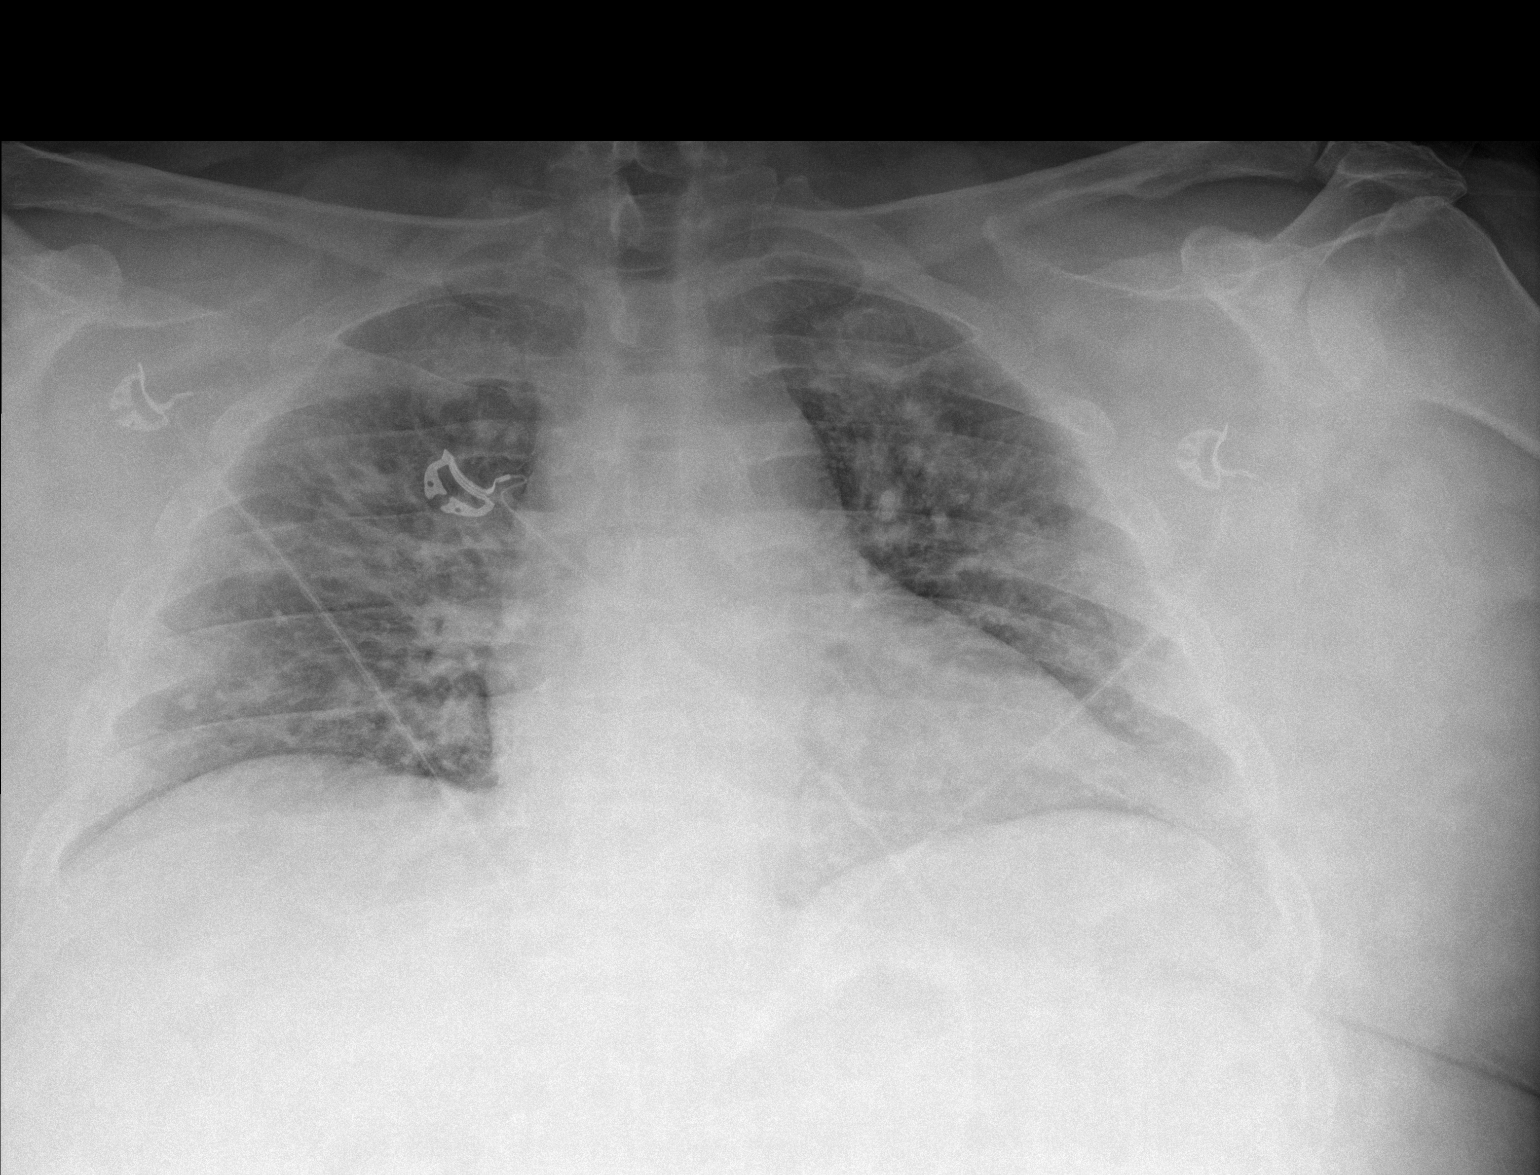

[1 of 1 positions shown; findings below may reference images not displayed]

FINDINGS: Line cardiomegaly. Diffuse severe bilateral interstitial prominence
noted. Interstitial edema and/or pneumonitis could present this
fashion. Low lung volumes. No pleural effusion or pneumothorax.
IMPRESSION: 1.  Borderline cardiomegaly.

2. Diffuse severe bilateral interstitial prominence. Interstitial
edema and/or pneumonitis could present in this fashion in this known
O3NUP-RT positive patient.

## 2022-05-28 ENCOUNTER — Ambulatory Visit
Admission: RE | Admit: 2022-05-28 | Discharge: 2022-05-28 | Disposition: A | Discharge status: Home / Self Care | Payer: BC Managed Care – PPO | Attending: Gastroenterology | Admitting: Gastroenterology

## 2022-05-28 ENCOUNTER — Encounter: Payer: Self-pay | Admitting: Gastroenterology

## 2022-05-28 ENCOUNTER — Ambulatory Visit: Payer: BC Managed Care – PPO | Admitting: Anesthesiology

## 2022-05-28 ENCOUNTER — Encounter
Admission: RE | Disposition: A | Discharge status: Home / Self Care | Payer: Self-pay | Source: Home / Self Care | Attending: Gastroenterology

## 2022-05-28 DIAGNOSIS — D120 Benign neoplasm of cecum: Secondary | ICD-10-CM | POA: Insufficient documentation

## 2022-05-28 DIAGNOSIS — Z6841 Body Mass Index (BMI) 40.0 and over, adult: Secondary | ICD-10-CM | POA: Diagnosis not present

## 2022-05-28 DIAGNOSIS — D123 Benign neoplasm of transverse colon: Secondary | ICD-10-CM | POA: Diagnosis not present

## 2022-05-28 DIAGNOSIS — I10 Essential (primary) hypertension: Secondary | ICD-10-CM | POA: Insufficient documentation

## 2022-05-28 DIAGNOSIS — Z1211 Encounter for screening for malignant neoplasm of colon: Secondary | ICD-10-CM | POA: Diagnosis present

## 2022-05-28 DIAGNOSIS — Z79899 Other long term (current) drug therapy: Secondary | ICD-10-CM | POA: Diagnosis not present

## 2022-05-28 DIAGNOSIS — K640 First degree hemorrhoids: Secondary | ICD-10-CM | POA: Insufficient documentation

## 2022-05-28 DIAGNOSIS — Z87891 Personal history of nicotine dependence: Secondary | ICD-10-CM | POA: Insufficient documentation

## 2022-05-28 DIAGNOSIS — D124 Benign neoplasm of descending colon: Secondary | ICD-10-CM | POA: Insufficient documentation

## 2022-05-28 HISTORY — PX: COLONOSCOPY WITH PROPOFOL: SHX5780

## 2022-05-28 SURGERY — COLONOSCOPY WITH PROPOFOL
Anesthesia: General

## 2022-05-28 MED ORDER — PROPOFOL 1000 MG/100ML IV EMUL
INTRAVENOUS | Status: AC
  Filled 2022-05-28: qty 100

## 2022-05-28 MED ORDER — SODIUM CHLORIDE 0.9 % IV SOLN
INTRAVENOUS | Status: DC
  Administered 2022-05-28: 1000 mL via INTRAVENOUS

## 2022-05-28 MED ORDER — PROPOFOL 500 MG/50ML IV EMUL
INTRAVENOUS | Status: DC | PRN
  Administered 2022-05-28: 150 ug/kg/min via INTRAVENOUS
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
## 2022-05-28 NOTE — Transfer of Care (Signed)
Immediate Anesthesia Transfer of Care Note  Patient: Brandon Ford  Procedure(s) Performed: COLONOSCOPY WITH PROPOFOL  Patient Location: PACU  Anesthesia Type:General  Level of Consciousness: awake and sedated  Airway & Oxygen Therapy: Patient Spontanous Breathing and Patient connected to face mask oxygen  Post-op Assessment: Report given to RN and Post -op Vital signs reviewed and stable  Post vital signs: Reviewed and stable  Last Vitals:  Vitals Value Taken Time  BP    Temp    Pulse    Resp    SpO2      Last Pain:  Vitals:   05/28/22 1138  TempSrc: Temporal  PainSc: 0-No pain         Complications: No notable events documented.

## 2022-05-28 NOTE — Interval H&P Note (Signed)
History and Physical Interval Note: Preprocedure H&P from 05/28/22  was reviewed and there was no interval change after seeing and examining the patient.  Written consent was obtained from the patient after discussion of risks, benefits, and alternatives. Patient has consented to proceed with Colonoscopy with possible intervention   05/28/2022 11:44 AM  [name]  has presented today for surgery, with the diagnosis of colon cancer screening.  The various methods of treatment have been discussed with the patient and family. After consideration of risks, benefits and other options for treatment, the patient has consented to  Procedure(s): COLONOSCOPY WITH PROPOFOL (N/A) as a surgical intervention.  The patient's history has been reviewed, patient examined, no change in status, stable for surgery.  I have reviewed the patient's chart and labs.  Questions were answered to the patient's satisfaction.     Brandon Ford

## 2022-05-28 NOTE — Anesthesia Postprocedure Evaluation (Signed)
Anesthesia Post Note  Patient: Brandon Ford  Procedure(s) Performed: COLONOSCOPY WITH PROPOFOL  Patient location during evaluation: PACU Anesthesia Type: General Level of consciousness: awake and alert Pain management: pain level controlled Vital Signs Assessment: post-procedure vital signs reviewed and stable Respiratory status: spontaneous breathing, nonlabored ventilation, respiratory function stable and patient connected to nasal cannula oxygen Cardiovascular status: blood pressure returned to baseline and stable Postop Assessment: no apparent nausea or vomiting Anesthetic complications: no   No notable events documented.   Last Vitals:  Vitals:   05/28/22 1314 05/28/22 1324  BP: 101/78 120/78  Pulse: (!) 107   Resp: (!) 23 16  Temp:    SpO2: 97% 98%    Last Pain:  Vitals:   05/28/22 1324  TempSrc:   PainSc: 0-No pain                 Molli Barrows

## 2022-05-28 NOTE — Anesthesia Procedure Notes (Signed)
Date/Time: 05/28/2022 12:29 PM  Performed by: Donalda Ewings, CindyPre-anesthesia Checklist: Patient identified, Emergency Drugs available, Suction available, Patient being monitored and Timeout performed Patient Re-evaluated:Patient Re-evaluated prior to induction Oxygen Delivery Method: Nasal cannula and Supernova nasal CPAP Preoxygenation: Pre-oxygenation with 100% oxygen Induction Type: IV induction Placement Confirmation: positive ETCO2 and CO2 detector
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
## 2022-05-28 NOTE — Op Note (Signed)
[facility] Patient Name: Brandon Ford Procedure Date: 05/28/2022 12:19 PM MRN: [id number] Account #: [id number] Date of Birth: 05/21/1976 Admit Type: Outpatient Age: 46 Room: [facility] ENDO ROOM 1 Gender: Male Note Status: Finalized Instrument Name: Colonscope [id number] Procedure:             Colonoscopy Indications:           Screening for colorectal malignant neoplasm Providers:             Rueben Bash, DO Referring MD:          Ocie Cornfield. Ouida Sills MD, MD (Referring MD) Medicines:             Monitored Anesthesia Care Complications:         No immediate complications. Estimated blood loss:                         Minimal. Procedure:             Pre-Anesthesia Assessment:                        - Prior to the procedure, a History and Physical was                         performed, and patient medications and allergies were                         reviewed. The patient is competent. The risks and                         benefits of the procedure and the sedation options and                         risks were discussed with the patient. All questions                         were answered and informed consent was obtained.                         Patient identification and proposed procedure were                         verified by the physician, the nurse, the anesthetist                         and the technician in the endoscopy suite. Mental                         Status Examination: alert and oriented. Airway                         Examination: normal oropharyngeal airway and neck                         mobility. Respiratory Examination: clear to                         auscultation. CV Examination: RRR, no murmurs, no S3  or S4. Prophylactic Antibiotics: The patient does not                         require prophylactic antibiotics. Prior                         Anticoagulants: The patient has taken no previous                          anticoagulant or antiplatelet agents. ASA Grade                         Assessment: III - A patient with severe systemic                         disease. After reviewing the risks and benefits, the                         patient was deemed in satisfactory condition to                         undergo the procedure. The anesthesia plan was to use                         monitored anesthesia care (MAC). Immediately prior to                         administration of medications, the patient was                         re-assessed for adequacy to receive sedatives. The                         heart rate, respiratory rate, oxygen saturations,                         blood pressure, adequacy of pulmonary ventilation, and                         response to care were monitored throughout the                         procedure. The physical status of the patient was                         re-assessed after the procedure.                        After obtaining informed consent, the colonoscope was                         passed under direct vision. Throughout the procedure,                         the patient's blood pressure, pulse, and oxygen                         saturations were monitored continuously. The  Colonoscope was introduced through the anus and                         advanced to the the terminal ileum, with                         identification of the appendiceal orifice and IC                         valve. The colonoscopy was performed without                         difficulty. The patient tolerated the procedure well.                         The quality of the bowel preparation was evaluated                         using the BBPS [facility] Bowel Preparation Scale) with                         scores of: Right Colon = 3, Transverse Colon = 3 and                         Left Colon = 3 (entire mucosa seen well with no                          residual staining, small fragments of stool or opaque                         liquid). The total BBPS score equals 9. The terminal                         ileum, ileocecal valve, appendiceal orifice, and                         rectum were photographed. Findings:      The perianal and digital rectal examinations were normal. Pertinent       negatives include normal sphincter tone.      Non-bleeding internal hemorrhoids were found during retroflexion. The       hemorrhoids were Grade I (internal hemorrhoids that do not prolapse).       Estimated blood loss: none.      The terminal ileum appeared normal.      Three sessile polyps were found in the descending colon, hepatic flexure       and cecum. The polyps were 4 to 11 mm in size. These polyps were removed       with a cold snare. Resection and retrieval were complete. Estimated       blood loss was minimal.      Two sessile polyps were found in the transverse colon. The polyps were 1       to 2 mm in size. These polyps were removed with a jumbo cold forceps.       Resection and retrieval were complete. Estimated blood loss was minimal.      The exam was otherwise without abnormality on direct and retroflexion       views.  Retroflexion in the right colon was performed. Impression:            - Non-bleeding internal hemorrhoids.                        - The examined portion of the ileum was normal.                        - Three 4 to 11 mm polyps in the descending colon, at                         the hepatic flexure and in the cecum, removed with a                         cold snare. Resected and retrieved.                        - Two 1 to 2 mm polyps in the transverse colon,                         removed with a jumbo cold forceps. Resected and                         retrieved.                        - The examination was otherwise normal on direct and                         retroflexion views. Recommendation:        -  Patient has a contact number available for                         emergencies. The signs and symptoms of potential                         delayed complications were discussed with the patient.                         Return to normal activities tomorrow. Written                         discharge instructions were provided to the patient.                        - Discharge patient to home.                        - Resume previous diet.                        - Continue present medications.                        - No aspirin, ibuprofen, naproxen, or other                         non-steroidal anti-inflammatory drugs after polyp  removal.                        - Await pathology results.                        - Repeat colonoscopy for surveillance based on                         pathology results.                        - Return to GI office as previously scheduled.                        - The findings and recommendations were discussed with                         the patient. Procedure Code(s):     --- Professional ---                        [phone], Colonoscopy, flexible; with removal of                         tumor(s), polyp(s), or other lesion(s) by snare                         technique                        [id number], 35, Colonoscopy, flexible; with biopsy, single                         or multiple Diagnosis Code(s):     --- Professional ---                        Z12.11, Encounter for screening for malignant neoplasm                         of colon                        K64.0, First degree hemorrhoids                        K63.5, Polyp of colon CPT copyright 2019 [employer]. All rights reserved. The codes documented in this report are preliminary and upon coder review may  be revised to meet current compliance requirements. Attending Participation:      I personally performed the entire procedure. Volney American, DO Annamaria Helling DO, DO 05/28/2022 1:05:33 PM This report has been signed electronically. Number of Addenda: 0 Note Initiated On: 05/28/2022 12:19 PM Scope Withdrawal Time: 0 hours 31 minutes 29 seconds  Total Procedure Duration: 0 hours 33 minutes 48 seconds  Estimated Blood Loss:  Estimated blood loss was minimal.      [facility]

## 2022-05-29 ENCOUNTER — Encounter: Payer: Self-pay | Admitting: Gastroenterology

## 2022-05-29 LAB — SURGICAL PATHOLOGY
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
## 2024-08-11 ENCOUNTER — Encounter: Payer: Self-pay | Admitting: Gastroenterology

## 2024-08-11 ENCOUNTER — Ambulatory Visit
Admission: RE | Admit: 2024-08-11 | Discharge: 2024-08-11 | Disposition: A | Discharge status: Home / Self Care | Attending: Gastroenterology | Admitting: Gastroenterology

## 2024-08-11 ENCOUNTER — Ambulatory Visit

## 2024-08-11 ENCOUNTER — Encounter: Admission: RE | Disposition: A | Payer: Self-pay | Source: Home / Self Care | Attending: Gastroenterology

## 2024-08-11 DIAGNOSIS — Z79899 Other long term (current) drug therapy: Secondary | ICD-10-CM | POA: Diagnosis not present

## 2024-08-11 DIAGNOSIS — D122 Benign neoplasm of ascending colon: Secondary | ICD-10-CM | POA: Insufficient documentation

## 2024-08-11 DIAGNOSIS — Z83719 Family history of colon polyps, unspecified: Secondary | ICD-10-CM | POA: Diagnosis not present

## 2024-08-11 DIAGNOSIS — K621 Rectal polyp: Secondary | ICD-10-CM | POA: Insufficient documentation

## 2024-08-11 DIAGNOSIS — Z1211 Encounter for screening for malignant neoplasm of colon: Secondary | ICD-10-CM | POA: Insufficient documentation

## 2024-08-11 DIAGNOSIS — K640 First degree hemorrhoids: Secondary | ICD-10-CM | POA: Insufficient documentation

## 2024-08-11 HISTORY — PX: COLONOSCOPY: SHX5424

## 2024-08-11 HISTORY — PX: POLYPECTOMY: SHX149

## 2024-08-11 SURGERY — COLONOSCOPY
Anesthesia: General

## 2024-08-11 MED ORDER — PHENYLEPHRINE 80 MCG/ML (10ML) SYRINGE FOR IV PUSH (FOR BLOOD PRESSURE SUPPORT)
PREFILLED_SYRINGE | INTRAVENOUS | Status: AC
  Filled 2024-08-11: qty 10

## 2024-08-11 MED ORDER — PROPOFOL 500 MG/50ML IV EMUL
INTRAVENOUS | Status: DC | PRN
Start: 2024-08-11 — End: 2024-08-11
  Administered 2024-08-11: 150 ug/kg/min via INTRAVENOUS
  Administered 2024-08-11: 50 mg via INTRAVENOUS

## 2024-08-11 MED ORDER — PROPOFOL 1000 MG/100ML IV EMUL
INTRAVENOUS | Status: AC
  Filled 2024-08-11: qty 100

## 2024-08-11 MED ORDER — SODIUM CHLORIDE 0.9 % IV SOLN
INTRAVENOUS | Status: DC
  Administered 2024-08-11: 500 mL via INTRAVENOUS

## 2024-08-11 NOTE — Interval H&P Note (Signed)
 History and Physical Interval Note: Preprocedure H&P from 08/11/24  was reviewed and there was no interval change after seeing and examining the patient.  Written consent was obtained from the patient after discussion of risks, benefits, and alternatives. Patient has consented to proceed with Colonoscopy with possible intervention   08/11/2024 10:55 AM  Coolidge K Huhn  has presented today for surgery, with the diagnosis of h/o TA Polyps.  The various methods of treatment have been discussed with the patient and family. After consideration of risks, benefits and other options for treatment, the patient has consented to  Procedure(s): COLONOSCOPY (N/A) as a surgical intervention.  The patient's history has been reviewed, patient examined, no change in status, stable for surgery.  I have reviewed the patient's chart and labs.  Questions were answered to the patient's satisfaction.     Elspeth Ozell Jungling
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
## 2024-08-11 NOTE — Anesthesia Postprocedure Evaluation (Signed)
 Anesthesia Post Note  Patient: Brandon Ford  Procedure(s) Performed: COLONOSCOPY POLYPECTOMY, INTESTINE  Patient location during evaluation: Endoscopy Anesthesia Type: General Level of consciousness: awake and alert Pain management: pain level controlled Vital Signs Assessment: post-procedure vital signs reviewed and stable Respiratory status: spontaneous breathing, nonlabored ventilation and respiratory function stable Cardiovascular status: blood pressure returned to baseline and stable Postop Assessment: no apparent nausea or vomiting Anesthetic complications: no   There were no known notable events for this encounter.   Last Vitals:  Vitals:   08/11/24 1133 08/11/24 1140  BP:  (!) 147/94  Pulse: 82 74  Resp: 19 18  Temp:    SpO2: 97% 98%    Last Pain:  Vitals:   08/11/24 1128  TempSrc: Temporal  PainSc:                  Fairy MARLA Radek Carnero

## 2024-08-11 NOTE — Op Note (Signed)
 [facility] Patient Name: Brandon Ford Procedure Date: 08/11/2024 10:52 AM MRN: [id number] Account #: [id number] Date of Birth: 1976/07/24 Admit Type: Outpatient Age: 48 Room: [facility] ENDO ROOM 1 Gender: Male Note Status: Finalized Instrument Name: Colon Scope [id number] Procedure:             Colonoscopy Indications:           High risk colon cancer surveillance: Personal history                         of colonic polyps Providers:             Elspeth Ozell Onita ROSALEA, DO Referring MD:          Layman ORN. Lenon MD, MD (Referring MD) Medicines:             Monitored Anesthesia Care Complications:         No immediate complications. Estimated blood loss:                         Minimal. Procedure:             Pre-Anesthesia Assessment:                        - Prior to the procedure, a History and Physical was                         performed, and patient medications and allergies were                         reviewed. The patient is competent. The risks and                         benefits of the procedure and the sedation options and                         risks were discussed with the patient. All questions                         were answered and informed consent was obtained.                         Patient identification and proposed procedure were                         verified by the physician, the nurse, the anesthetist                         and the technician in the endoscopy suite. Mental                         Status Examination: alert and oriented. Airway                         Examination: normal oropharyngeal airway and neck                         mobility. Respiratory Examination: clear to  auscultation. CV Examination: RRR, no murmurs, no S3                         or S4. Prophylactic Antibiotics: The patient does not                         require prophylactic antibiotics. Prior                          Anticoagulants: The patient has taken no anticoagulant                         or antiplatelet agents. ASA Grade Assessment: III - A                         patient with severe systemic disease. After reviewing                         the risks and benefits, the patient was deemed in                         satisfactory condition to undergo the procedure. The                         anesthesia plan was to use monitored anesthesia care                         (MAC). Immediately prior to administration of                         medications, the patient was re-assessed for adequacy                         to receive sedatives. The heart rate, respiratory                         rate, oxygen saturations, blood pressure, adequacy of                         pulmonary ventilation, and response to care were                         monitored throughout the procedure. The physical                         status of the patient was re-assessed after the                         procedure.                        After obtaining informed consent, the colonoscope was                         passed under direct vision. Throughout the procedure,                         the patient's blood pressure, pulse, and oxygen  saturations were monitored continuously. The                         Colonoscope was introduced through the anus and                         advanced to the the cecum, identified by appendiceal                         orifice and ileocecal valve. The colonoscopy was                         performed without difficulty. The patient tolerated                         the procedure well. The quality of the bowel                         preparation was evaluated using the BBPS [facility] Bowel                         Preparation Scale) with scores of: Right Colon = 2                         (minor amount of residual staining, small fragments of                         stool  and/or opaque liquid, but mucosa seen well),                         Transverse Colon = 3 (entire mucosa seen well with no                         residual staining, small fragments of stool or opaque                         liquid) and Left Colon = 2 (minor amount of residual                         staining, small fragments of stool and/or opaque                         liquid, but mucosa seen well). The total BBPS score                         equals 7. The quality of the bowel preparation was                         good. The ileocecal valve, appendiceal orifice, and                         rectum were photographed. Findings:      The perianal and digital rectal examinations were normal. Pertinent       negatives include normal sphincter tone.      The terminal ileum appeared normal. Estimated blood loss: none.      Retroflexion in the right colon was performed.  Two sessile polyps were found in the rectum and ascending colon. The       polyps were 1 to 2 mm in size. These polyps were removed with a jumbo       cold forceps. Resection and retrieval were complete. Estimated blood       loss was minimal.      Non-bleeding internal hemorrhoids were found during retroflexion. The       hemorrhoids were Grade I (internal hemorrhoids that do not prolapse).       Estimated blood loss: none.      The exam was otherwise without abnormality on direct and retroflexion       views. Impression:            - The examined portion of the ileum was normal.                        - Two 1 to 2 mm polyps in the rectum and in the                         ascending colon, removed with a jumbo cold forceps.                         Resected and retrieved.                        - Non-bleeding internal hemorrhoids.                        - The examination was otherwise normal on direct and                         retroflexion views. Recommendation:        - Patient has a contact number available for                          emergencies. The signs and symptoms of potential                         delayed complications were discussed with the patient.                         Return to normal activities tomorrow. Written                         discharge instructions were provided to the patient.                        - Discharge patient to home.                        - Resume previous diet.                        - Continue present medications.                        - Await pathology results.                        - Repeat colonoscopy for surveillance based on  pathology results.                        - Return to referring physician as previously                         scheduled.                        - The findings and recommendations were discussed with                         the patient. Procedure Code(s):     --- Professional ---                        [phone], Colonoscopy, flexible; with biopsy, single or                         multiple Diagnosis Code(s):     --- Professional ---                        Z86.010, Personal history of colonic polyps                        K64.0, First degree hemorrhoids                        D12.8, Benign neoplasm of rectum                        D12.2, Benign neoplasm of ascending colon CPT copyright 2022 [employer]. All rights reserved. The codes documented in this report are preliminary and upon coder review may  be revised to meet current compliance requirements. Attending Participation:      I personally performed the entire procedure. Elspeth Jungling, DO Elspeth Ozell Jungling DO, DO 08/11/2024 11:26:08 AM This report has been signed electronically. Number of Addenda: 0 Note Initiated On: 08/11/2024 10:52 AM Estimated Blood Loss:  Estimated blood loss was minimal.      [facility]

## 2024-08-11 NOTE — Transfer of Care (Signed)
 Immediate Anesthesia Transfer of Care Note  Patient: Brandon Ford  Procedure(s) Performed: COLONOSCOPY POLYPECTOMY, INTESTINE  Patient Location: Endoscopy Unit  Anesthesia Type:General  Level of Consciousness: awake  Airway & Oxygen Therapy: Patient Spontanous Breathing  Post-op Assessment: Report given to RN and Post -op Vital signs reviewed and stable  Post vital signs: Reviewed and stable  Last Vitals:  Vitals Value Taken Time  BP    Temp 35.6 C 08/11/24 11:28  Pulse 86 08/11/24 11:28  Resp 23 08/11/24 11:28  SpO2 93 % 08/11/24 11:28    Last Pain:  Vitals:   08/11/24 1128  TempSrc: Temporal  PainSc:          Complications: There were no known notable events for this encounter.

## 2024-08-15 LAB — SURGICAL PATHOLOGY
